# Patient Record
Sex: Female | Born: 1965 | Race: White | Hispanic: No | Marital: Married | Smoking: Never smoker
Health system: Southern US, Community
[De-identification: ages and names within clinical notes are randomized; demographics above are authoritative.]

## PROBLEM LIST (undated history)

## (undated) DIAGNOSIS — Z87442 Personal history of urinary calculi: Secondary | ICD-10-CM

## (undated) DIAGNOSIS — H04123 Dry eye syndrome of bilateral lacrimal glands: Secondary | ICD-10-CM

## (undated) DIAGNOSIS — K649 Unspecified hemorrhoids: Secondary | ICD-10-CM

## (undated) DIAGNOSIS — M1990 Unspecified osteoarthritis, unspecified site: Secondary | ICD-10-CM

## (undated) DIAGNOSIS — R51 Headache: Secondary | ICD-10-CM

## (undated) DIAGNOSIS — L989 Disorder of the skin and subcutaneous tissue, unspecified: Secondary | ICD-10-CM

## (undated) DIAGNOSIS — G43909 Migraine, unspecified, not intractable, without status migrainosus: Secondary | ICD-10-CM

## (undated) DIAGNOSIS — K5792 Diverticulitis of intestine, part unspecified, without perforation or abscess without bleeding: Secondary | ICD-10-CM

## (undated) DIAGNOSIS — Z8489 Family history of other specified conditions: Secondary | ICD-10-CM

## (undated) HISTORY — PX: ABDOMINAL HYSTERECTOMY: SHX81

## (undated) HISTORY — DX: Diverticulitis of intestine, part unspecified, without perforation or abscess without bleeding: K57.92

## (undated) HISTORY — PX: COLONOSCOPY: SHX174

## (undated) HISTORY — DX: Dry eye syndrome of bilateral lacrimal glands: H04.123

## (undated) HISTORY — DX: Disorder of the skin and subcutaneous tissue, unspecified: L98.9

## (undated) HISTORY — PX: OTHER SURGICAL HISTORY: SHX169

## (undated) HISTORY — PX: INCONTINENCE SURGERY: SHX676

## (undated) HISTORY — PX: CHOLECYSTECTOMY: SHX55

## (undated) HISTORY — DX: Migraine, unspecified, not intractable, without status migrainosus: G43.909

## (undated) HISTORY — DX: Unspecified hemorrhoids: K64.9

---
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
## 2002-08-03 HISTORY — PX: CHOLECYSTECTOMY: SHX55

## 2004-08-03 HISTORY — PX: ABDOMINAL HYSTERECTOMY: SHX81

## 2011-04-19 ENCOUNTER — Emergency Department (HOSPITAL_COMMUNITY): Payer: No Typology Code available for payment source

## 2011-04-19 ENCOUNTER — Emergency Department (HOSPITAL_COMMUNITY)
Admission: EM | Admit: 2011-04-19 | Discharge: 2011-04-19 | Disposition: A | Discharge status: Home / Self Care | Payer: No Typology Code available for payment source | Attending: Emergency Medicine | Admitting: Emergency Medicine

## 2011-04-19 DIAGNOSIS — S52509A Unspecified fracture of the lower end of unspecified radius, initial encounter for closed fracture: Secondary | ICD-10-CM | POA: Insufficient documentation

## 2011-04-19 HISTORY — PX: OTHER SURGICAL HISTORY: SHX169

## 2013-02-01 ENCOUNTER — Other Ambulatory Visit: Payer: Self-pay | Admitting: Family Medicine

## 2013-02-01 DIAGNOSIS — Z1231 Encounter for screening mammogram for malignant neoplasm of breast: Secondary | ICD-10-CM

## 2013-02-07 ENCOUNTER — Other Ambulatory Visit: Payer: Self-pay | Admitting: *Deleted

## 2013-02-07 DIAGNOSIS — I83893 Varicose veins of bilateral lower extremities with other complications: Secondary | ICD-10-CM

## 2013-02-21 ENCOUNTER — Ambulatory Visit
Admission: RE | Admit: 2013-02-21 | Discharge: 2013-02-21 | Disposition: A | Discharge status: Home / Self Care | Payer: BC Managed Care – PPO | Source: Ambulatory Visit | Attending: Family Medicine | Admitting: Family Medicine

## 2013-02-21 DIAGNOSIS — Z1231 Encounter for screening mammogram for malignant neoplasm of breast: Secondary | ICD-10-CM

## 2013-03-20 ENCOUNTER — Encounter: Payer: Self-pay | Admitting: Vascular Surgery

## 2013-03-21 ENCOUNTER — Encounter (INDEPENDENT_AMBULATORY_CARE_PROVIDER_SITE_OTHER): Payer: BC Managed Care – PPO | Admitting: *Deleted

## 2013-03-21 ENCOUNTER — Ambulatory Visit (INDEPENDENT_AMBULATORY_CARE_PROVIDER_SITE_OTHER): Payer: BC Managed Care – PPO | Admitting: Vascular Surgery

## 2013-03-21 ENCOUNTER — Encounter: Payer: Self-pay | Admitting: Vascular Surgery

## 2013-03-21 VITALS — BP 149/96 | HR 66 | Resp 16 | Ht 64.0 in | Wt 192.0 lb

## 2013-03-21 DIAGNOSIS — Z01810 Encounter for preprocedural cardiovascular examination: Secondary | ICD-10-CM

## 2013-03-21 DIAGNOSIS — I83893 Varicose veins of bilateral lower extremities with other complications: Secondary | ICD-10-CM

## 2013-03-21 NOTE — Progress Notes (Signed)
Subjective:     Patient ID: Sarah Krause, female   DOB: 07/22/1966, 47 y.o.   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 These have been present for the past few years and the symptoms have become more severe. This includes aching throbbing burning and stinging discomfort in the right leg which worsens as the day progresses. She has tried short leg elastic compression stockings with no improvement. She has no history of DVT thrombophlebitis stasis ulcers or bleeding. She has no symptoms in the contralateral left leg. 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  History reviewed. No pertinent past medical history.  History  Substance Use Topics  . Smoking status: Never Smoker   . Smokeless tobacco: Never Used  . Alcohol Use: 0.6 oz/week    1 Glasses of wine per week    Family History  Problem Relation Age of Onset  . Heart disease Father   . Diabetes Brother     Allergies  Allergen Reactions  . Sulfa Antibiotics Hives    No current outpatient prescriptions on file.  BP 149/96  Pulse 66  Resp 16  Ht 5\' 4"  (1.626 m)  Wt 192 lb (87.091 kg)  BMI 32.94 kg/m2  Body mass index is 32.94 kg/(m^2).          Review of Systems denies chest pain, dyspnea on exertion, PND, orthopnea, hemoptysis, claudication. All systems negative complete review of systems    Objective:   Physical Exam BP 149/96  Pulse 66  Resp 16  Ht 5\' 4"  (1.626 m)  Wt 192 lb (87.091 kg)  BMI 32.94 kg/m2  Gen.-alert and oriented x3 in no apparent distress HEENT normal for age Lungs no rhonchi or wheezing Cardiovascular regular rhythm no murmurs carotid pulses 3+ palpable no bruits audible Abdomen soft nontender no palpable masses Musculoskeletal free of  major deformities Skin clear -no rashes Neurologic normal Lower extremities 3+ femoral and dorsalis pedis pulses palpable  bilaterally with no edema Right leg with bulging varicosities of the great saphenous system beginning in the knee and extending down to the medial malleolus. There is 1+ edema distally. No hyperpigmentation or active ulceration is noted. Left leg is free varicosities.  Today I ordered a venous duplex exam of the right leg which are reviewed and interpreted. There is gross reflux throughout the right great saphenous vein from the saphenofemoral junction to the knee with a large size vein and no DVT. There also is some mild reflux in the right small saphenous vein but it is a small-caliber vein.     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 If no dramatic improvement patient needs laser ablation right great saphenous vein with 10-20 stab phlebectomy a single procedure

## 2013-06-26 ENCOUNTER — Encounter: Payer: Self-pay | Admitting: Vascular Surgery

## 2013-06-27 ENCOUNTER — Encounter: Payer: Self-pay | Admitting: Vascular Surgery

## 2013-06-27 ENCOUNTER — Ambulatory Visit (INDEPENDENT_AMBULATORY_CARE_PROVIDER_SITE_OTHER): Payer: BC Managed Care – PPO | Admitting: Vascular Surgery

## 2013-06-27 VITALS — BP 132/91 | HR 71 | Ht 64.0 in | Wt 198.3 lb

## 2013-06-27 DIAGNOSIS — I83893 Varicose veins of bilateral lower extremities with other complications: Secondary | ICD-10-CM

## 2013-06-27 NOTE — Progress Notes (Signed)
Subjective:     Patient ID: Sarah Krause, female   DOB: [birth year], 47 y.o.   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 She has tried long-leg elastic compression stockings 20-30 mm gradient as well as elevation and ibuprofen and continues to have aching throbbing and burning discomfort as well as progressive edema in the ankle area as the day progresses. She has no history of DVT or thrombophlebitis. 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  No past medical history on file.  History  Substance Use Topics  . Smoking status: Never Smoker   . Smokeless tobacco: Never Used  . Alcohol Use: 0.6 oz/week    1 Glasses of wine per week    Family History  Problem Relation Age of Onset  . Heart disease Father   . Diabetes Brother     Allergies  Allergen Reactions  . Sulfa Antibiotics Hives    Current outpatient prescriptions:Dextromethorphan-Guaifenesin (MUCINEX DM PO), Take 1 tablet by mouth 2 (two) times daily. , Disp: , Rfl:   BP 132/91  Pulse 71  Ht 5\' 4"  (1.626 m)  Wt 198 lb 4.8 oz (89.948 kg)  BMI 34.02 kg/m2  SpO2 100%  Body mass index is 34.02 kg/(m^2).           Review of Systems denies chest pain, dyspnea on exertion, PND, orthopnea, hemoptysis    Objective:   Physical Exam BP 132/91  Pulse 71  Ht 5\' 4"  (1.626 m)  Wt 198 lb 4.8 oz (89.948 kg)  BMI 34.02 kg/m2  SpO2 100%  General well-developed well-nourished female no apparent stress alert and oriented x3 Lungs no rhonchi or wheezing Right lower extremity with 3+ femoral and dorsalis pedis pulse palpable. There bulging varicosities in the medial calf along the course the great saphenous vein. 1+ distal edema with no active ulcer or hyperpigmentation.     Assessment:     Gross reflux right great saphenous vein with painful  varicosities-affecting patient's daily living and ability to work-resistant to conservative measures    Plan:     Patient needs a laser ablation right great saphenous vein with 10-20 stab phlebectomy of painful varicosities Will proceed with precertification to perform this in the near future

## 2013-07-06 ENCOUNTER — Telehealth: Payer: Self-pay | Admitting: Vascular Surgery

## 2013-07-06 NOTE — Telephone Encounter (Signed)
° ° °   Micki Riley, RN [facility]            Jerrell Mylar needs a one wk fu lab and JDL on 1/19. Her DOB: 2065/09/20      07/06/13: sent to [facility]

## 2013-07-11 ENCOUNTER — Other Ambulatory Visit: Payer: Self-pay | Admitting: *Deleted

## 2013-07-11 DIAGNOSIS — I83893 Varicose veins of bilateral lower extremities with other complications: Secondary | ICD-10-CM

## 2013-08-14 ENCOUNTER — Other Ambulatory Visit: Payer: BC Managed Care – PPO | Admitting: Vascular Surgery

## 2013-08-21 ENCOUNTER — Encounter (HOSPITAL_COMMUNITY): Payer: BC Managed Care – PPO

## 2013-08-21 ENCOUNTER — Ambulatory Visit: Payer: BC Managed Care – PPO | Admitting: Vascular Surgery

## 2014-08-03 DIAGNOSIS — I83891 Varicose veins of right lower extremities with other complications: Secondary | ICD-10-CM

## 2014-08-03 HISTORY — DX: Varicose veins of right lower extremity with other complications: I83.891

## 2014-08-03 HISTORY — PX: INCONTINENCE SURGERY: SHX676

## 2014-08-03 HISTORY — PX: OTHER SURGICAL HISTORY: SHX169

## 2014-08-06 ENCOUNTER — Other Ambulatory Visit: Payer: Self-pay | Admitting: *Deleted

## 2014-08-06 DIAGNOSIS — I83891 Varicose veins of right lower extremities with other complications: Secondary | ICD-10-CM

## 2014-08-20 ENCOUNTER — Encounter: Payer: Self-pay | Admitting: Vascular Surgery

## 2014-08-21 ENCOUNTER — Ambulatory Visit (INDEPENDENT_AMBULATORY_CARE_PROVIDER_SITE_OTHER): Payer: BLUE CROSS/BLUE SHIELD | Admitting: Vascular Surgery

## 2014-08-21 ENCOUNTER — Ambulatory Visit (HOSPITAL_COMMUNITY)
Admission: RE | Admit: 2014-08-21 | Discharge: 2014-08-21 | Disposition: A | Discharge status: Home / Self Care | Payer: BLUE CROSS/BLUE SHIELD | Source: Ambulatory Visit | Attending: Vascular Surgery | Admitting: Vascular Surgery

## 2014-08-21 ENCOUNTER — Encounter: Payer: Self-pay | Admitting: Vascular Surgery

## 2014-08-21 VITALS — BP 136/89 | HR 62 | Resp 16 | Ht 64.0 in | Wt 168.0 lb

## 2014-08-21 DIAGNOSIS — I83891 Varicose veins of right lower extremities with other complications: Secondary | ICD-10-CM | POA: Insufficient documentation

## 2014-08-21 NOTE — Progress Notes (Signed)
Subjective:     Patient ID: Sarah Krause, female   DOB: 23-Jul-1966, 49 y.o.   MRN: [id number]  HPI this 49 year old female returns for continued follow-up regarding her painful varicosities in the right leg. I last saw her in November 2014. She was found to have gross reflux in her right great saphenous vein with painful varicosities in the right thigh and calf. These symptoms were not responding to conservative measures including long-leg elastic compression stockings 20-30 millimeter gradient, elevation, and ibuprofen. 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 She is now ready to proceed with treatment. She continues to wear a long-leg elastic compression stockings with no improvement. She has no history of DVT. She has also lost 30 pounds over this time period and this has not helped her symptoms either.  History reviewed. No pertinent past medical history.  History  Substance Use Topics  . Smoking status: Never Smoker   . Smokeless tobacco: Never Used  . Alcohol Use: 0.6 oz/week    1 Glasses of wine per week    Family History  Problem Relation Age of Onset  . Heart disease Father   . Diabetes Brother     Allergies  Allergen Reactions  . Sulfa Antibiotics Hives     Current outpatient prescriptions:  .  Dextromethorphan-Guaifenesin (MUCINEX DM PO), Take 1 tablet by mouth 2 (two) times daily. , Disp: , Rfl:   BP 136/89 mmHg  Pulse 62  Resp 16  Ht 5\' 4"  (1.626 m)  Wt 168 lb (76.204 kg)  BMI 28.82 kg/m2  Body mass index is 28.82 kg/(m^2).           Review of Systems denies chest pain, dyspnea on exertion, PND, orthopnea, hemoptysis, claudication.     Objective:   Physical Exam BP 136/89 mmHg  Pulse 62  Resp 16  Ht 5\' 4"  (1.626 m)  Wt 168 lb (76.204 kg)  BMI 28.82 kg/m2  Gen. alert and oriented 3 in no apparent distress Lungs no rhonchi or wheezing Right leg with 1+  chronic edema. Large bulging varicosities beginning in the distal thigh medially over the great saphenous vein extending to the ankle. No hyperpigmentation or active ulceration noted.  Today I ordered a venous duplex exam which I reviewed and interpreted. The great saphenous vein is quite large and has gross reflux throughout up to the saphenofemoral junction and there is no DVT.      Assessment:     Painful varicosities right leg due to gross reflux right great saphenous vein. 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    Plan:     Patient needs laser ablation right great saphenous vein +10-20 stab phlebectomy of painful varicosities as a single procedure We will proceed with precertification to perform this in the near future to relieve her symptoms.

## 2014-09-07 ENCOUNTER — Encounter (HOSPITAL_BASED_OUTPATIENT_CLINIC_OR_DEPARTMENT_OTHER): Payer: Self-pay

## 2014-09-07 ENCOUNTER — Ambulatory Visit (HOSPITAL_BASED_OUTPATIENT_CLINIC_OR_DEPARTMENT_OTHER)
Admission: RE | Admit: 2014-09-07 | Discharge: 2014-09-07 | Disposition: A | Discharge status: Home / Self Care | Payer: BLUE CROSS/BLUE SHIELD | Source: Ambulatory Visit | Attending: Physician Assistant | Admitting: Physician Assistant

## 2014-09-07 ENCOUNTER — Other Ambulatory Visit (HOSPITAL_BASED_OUTPATIENT_CLINIC_OR_DEPARTMENT_OTHER): Payer: Self-pay | Admitting: Physician Assistant

## 2014-09-07 DIAGNOSIS — Z9049 Acquired absence of other specified parts of digestive tract: Secondary | ICD-10-CM | POA: Diagnosis not present

## 2014-09-07 DIAGNOSIS — Z90710 Acquired absence of both cervix and uterus: Secondary | ICD-10-CM | POA: Insufficient documentation

## 2014-09-07 DIAGNOSIS — R109 Unspecified abdominal pain: Secondary | ICD-10-CM | POA: Insufficient documentation

## 2014-09-07 DIAGNOSIS — R933 Abnormal findings on diagnostic imaging of other parts of digestive tract: Secondary | ICD-10-CM | POA: Diagnosis not present

## 2014-09-07 HISTORY — PX: OTHER SURGICAL HISTORY: SHX169

## 2014-09-07 MED ORDER — IOHEXOL 300 MG/ML  SOLN
100.0000 mL | Freq: Once | INTRAMUSCULAR | Status: AC | PRN
  Administered 2014-09-07: 100 mL via INTRAVENOUS

## 2014-10-19 ENCOUNTER — Encounter: Payer: Self-pay | Admitting: Vascular Surgery

## 2014-10-22 ENCOUNTER — Ambulatory Visit (INDEPENDENT_AMBULATORY_CARE_PROVIDER_SITE_OTHER): Payer: BLUE CROSS/BLUE SHIELD | Admitting: Vascular Surgery

## 2014-10-22 ENCOUNTER — Encounter: Payer: Self-pay | Admitting: Vascular Surgery

## 2014-10-22 VITALS — BP 135/90 | HR 68 | Resp 16 | Ht 64.0 in | Wt 171.0 lb

## 2014-10-22 DIAGNOSIS — I83891 Varicose veins of right lower extremities with other complications: Secondary | ICD-10-CM

## 2014-10-22 DIAGNOSIS — I83899 Varicose veins of unspecified lower extremities with other complications: Secondary | ICD-10-CM | POA: Insufficient documentation

## 2014-10-22 NOTE — Progress Notes (Signed)
Subjective:     Patient ID: Sarah Krause, female   DOB: [birth year], 49 y.o.   MRN: [id number]  HPI patient had laser ablation of the right great saphenous vein +10-20 stab phlebectomy of painful varicosities performed under local tumescent anesthesia. She tolerated the procedure well. A total of 1824 J of energy was utilized.    Review of Systems     Objective:   Physical Exam BP 135/90 mmHg  Pulse 68  Resp 16  Ht 5\' 4"  (1.626 m)  Wt 171 lb (77.565 kg)  BMI 29.34 kg/m2       Assessment:     Well-tolerated laser ablation right great saphenous vein +10-20 stab phlebectomy of painful varicosities performed under local tumescent anesthesia    Plan:     Return in one week for venous duplex exam to confirm closure right great saphenous vein

## 2014-10-22 NOTE — Progress Notes (Signed)
Laser Ablation Procedure    Date: 10/22/2014   Sarah Krause DOB:[birth year]  Consent signed: Yes    Surgeon:  Dr. Nelda Severe. Kellie Simmering  Procedure: Laser Ablation: right Greater Saphenous Vein  BP 135/90 mmHg  Pulse 68  Resp 16  Ht 5\' 4"  (1.626 m)  Wt 171 lb (77.565 kg)  BMI 29.34 kg/m2  Tumescent Anesthesia: 400 cc 0.9% NaCl with 50 cc Lidocaine HCL with 1% Epi and 15 cc 8.4% NaHCO3  Local Anesthesia: 8 cc Lidocaine HCL and NaHCO3 (ratio 2:1)  Pulsed Mode: 15 watts, 593ms delay, 1.0 duration  Total Energy:1826              Total Pulses:122                Total Time: 2:02    Stab Phlebectomy: 10-20 Sites: Calf  Patient tolerated procedure well  Notes:   Description of Procedure:  After marking the course of the secondary varicosities, the patient was placed on the operating table in the supine position, and the right leg was prepped and draped in sterile fashion.   Local anesthetic was administered and under ultrasound guidance the saphenous vein was accessed with a micro needle and guide wire; then the mirco puncture sheath was placed.  A guide wire was inserted saphenofemoral junction , followed by a 5 french sheath.  The position of the sheath and then the laser fiber below the junction was confirmed using the ultrasound.  Tumescent anesthesia was administered along the course of the saphenous vein using ultrasound guidance. The patient was placed in Trendelenburg position and protective laser glasses were placed on patient and staff, and the laser was fired at 15 watts continuous mode advancing 1-10mm/second for a total of 1826 joules.   For stab phlebectomies, local anesthetic was administered at the previously marked varicosities, and tumescent anesthesia was administered around the vessels.  Ten to 20 stab wounds were made using the tip of an 11 blade. And using the vein hook, the phlebectomies were performed using a hemostat to avulse the varicosities.  Adequate hemostasis was  achieved.     Steri strips were applied to the stab wounds and ABD pads and thigh high compression stockings were applied.  Ace wrap bandages were applied over the phlebectomy sites and at the top of the saphenofemoral junction. Blood loss was less than 15 cc.  The patient ambulated out of the operating room having tolerated the procedure well.

## 2014-10-23 ENCOUNTER — Telehealth: Payer: Self-pay | Admitting: *Deleted

## 2014-10-23 NOTE — Telephone Encounter (Signed)
Pt having some discomfort. No bleeding. Told her she could loosen the ace wraps. She thinks that will help her discomfort. Following all instructions.

## 2014-10-29 ENCOUNTER — Other Ambulatory Visit: Payer: BLUE CROSS/BLUE SHIELD | Admitting: Vascular Surgery

## 2014-10-29 ENCOUNTER — Encounter: Payer: Self-pay | Admitting: Vascular Surgery

## 2014-10-30 ENCOUNTER — Encounter: Payer: Self-pay | Admitting: Vascular Surgery

## 2014-10-30 ENCOUNTER — Ambulatory Visit (INDEPENDENT_AMBULATORY_CARE_PROVIDER_SITE_OTHER): Payer: Self-pay | Admitting: Vascular Surgery

## 2014-10-30 ENCOUNTER — Other Ambulatory Visit: Payer: Self-pay | Admitting: Vascular Surgery

## 2014-10-30 ENCOUNTER — Ambulatory Visit (HOSPITAL_COMMUNITY)
Admission: RE | Admit: 2014-10-30 | Discharge: 2014-10-30 | Disposition: A | Discharge status: Home / Self Care | Payer: BLUE CROSS/BLUE SHIELD | Source: Ambulatory Visit | Attending: Vascular Surgery | Admitting: Vascular Surgery

## 2014-10-30 VITALS — BP 140/87 | HR 72 | Resp 16 | Ht 64.0 in | Wt 172.0 lb

## 2014-10-30 DIAGNOSIS — I83891 Varicose veins of right lower extremities with other complications: Secondary | ICD-10-CM

## 2014-10-30 DIAGNOSIS — I8393 Asymptomatic varicose veins of bilateral lower extremities: Secondary | ICD-10-CM

## 2014-10-30 DIAGNOSIS — Z48812 Encounter for surgical aftercare following surgery on the circulatory system: Secondary | ICD-10-CM | POA: Diagnosis present

## 2014-10-30 NOTE — Progress Notes (Signed)
Subjective:     Patient ID: Clydene Pugh, female   DOB: 10/02/1965, 49 y.o.   MRN: [id number]  HPI this 49 year old female returns 1 week post laser ablation right great saphenous vein plus multiple stab phlebectomy of painful varicosities due to reflux in the right great saphenous vein. She states that the lower leg artery feels much better with less tightness and heaviness and less discomfort. She has had no distal edema. She did take her ibuprofen as instructed and wear the long leg elastic compression stocking.   Review of Systems     Objective:   Physical Exam BP 140/87 mmHg  Pulse 72  Resp 16  Ht 5\' 4"  (1.626 m)  Wt 172 lb (78.019 kg)  BMI 29.51 kg/m2  Gen. well-developed well-nourished female no apparent distress alert and oriented 3 Right leg with mild tenderness to palpation of great saphenous vein from distal thigh proximally. No erythema or blistering noted. Stab phlebectomy sites healing nicely. No distal edema. 3+ dorsalis pedis pulse palpable.  Today I ordered a venous duplex exam of the right leg which I reviewed and interpreted. There is no DVT. There is total closure of the right great saphenous vein.     Assessment:     Successful and well-tolerated laser ablation right great saphenous vein with multiple stab phlebectomy of painful varicosities     Plan:     Return to see me on when necessary basis

## 2014-11-05 ENCOUNTER — Ambulatory Visit: Payer: BLUE CROSS/BLUE SHIELD | Admitting: Vascular Surgery

## 2014-11-05 ENCOUNTER — Encounter (HOSPITAL_COMMUNITY): Payer: BLUE CROSS/BLUE SHIELD

## 2015-01-22 DIAGNOSIS — N819 Female genital prolapse, unspecified: Secondary | ICD-10-CM | POA: Insufficient documentation

## 2016-01-08 DIAGNOSIS — J019 Acute sinusitis, unspecified: Secondary | ICD-10-CM | POA: Diagnosis not present

## 2016-10-01 DIAGNOSIS — S83242A Other tear of medial meniscus, current injury, left knee, initial encounter: Secondary | ICD-10-CM | POA: Diagnosis not present

## 2016-10-22 DIAGNOSIS — S83242D Other tear of medial meniscus, current injury, left knee, subsequent encounter: Secondary | ICD-10-CM | POA: Diagnosis not present

## 2016-11-24 DIAGNOSIS — W57XXXA Bitten or stung by nonvenomous insect and other nonvenomous arthropods, initial encounter: Secondary | ICD-10-CM | POA: Diagnosis not present

## 2016-11-24 DIAGNOSIS — J329 Chronic sinusitis, unspecified: Secondary | ICD-10-CM | POA: Diagnosis not present

## 2017-04-03 DIAGNOSIS — L237 Allergic contact dermatitis due to plants, except food: Secondary | ICD-10-CM | POA: Diagnosis not present

## 2017-04-07 DIAGNOSIS — B351 Tinea unguium: Secondary | ICD-10-CM | POA: Diagnosis not present

## 2017-04-17 DIAGNOSIS — L259 Unspecified contact dermatitis, unspecified cause: Secondary | ICD-10-CM | POA: Diagnosis not present

## 2017-07-22 ENCOUNTER — Ambulatory Visit (INDEPENDENT_AMBULATORY_CARE_PROVIDER_SITE_OTHER): Payer: BLUE CROSS/BLUE SHIELD | Admitting: Podiatry

## 2017-07-22 ENCOUNTER — Ambulatory Visit: Payer: Self-pay

## 2017-07-22 DIAGNOSIS — M722 Plantar fascial fibromatosis: Secondary | ICD-10-CM

## 2017-07-22 MED ORDER — MELOXICAM 15 MG PO TABS: 15.0000 mg | ORAL_TABLET | Freq: Every day | ORAL | 0 refills | Status: DC

## 2017-07-22 NOTE — Progress Notes (Signed)
   Subjective:    Patient ID: Sarah Krause, female    DOB: 03/30/1966, 51 y.o.   MRN: [id number] Chief Complaint  Patient presents with  . Foot Pain    R. lateral arch pain x 1 mo; 9/10 sharp pain Tx; Aspirin     HPI 51 y.o. female presents with the above complaint.  Reports right lateral arch pain 9 out of 10 pain has been treated with as before.  Works for [employer].  No past medical history on file. Past Surgical History:  Procedure Laterality Date  . ABDOMINAL HYSTERECTOMY    . CHOLECYSTECTOMY      Current Outpatient Medications:  .  Dextromethorphan-Guaifenesin (MUCINEX DM PO), Take 1 tablet by mouth 2 (two) times daily. , Disp: , Rfl:  .  meloxicam (MOBIC) 15 MG tablet, Take 1 tablet (15 mg total) by mouth daily., Disp: 30 tablet, Rfl: 0  Allergies  Allergen Reactions  . Sulfa Antibiotics Hives    Review of Systems  All other systems reviewed and are negative.     Objective:   Physical Exam There were no vitals filed for this visit. General AA&O x3. Normal mood and affect.  Vascular Dorsalis pedis and posterior tibial pulses  present 2+ bilaterally  Capillary refill normal to all digits. Pedal hair growth normal.  Neurologic Epicritic sensation grossly present.  Dermatologic No open lesions. Interspaces clear of maceration. Nails well groomed and normal in appearance.  Orthopedic: MMT 5/5 in dorsiflexion, plantarflexion, inversion, and eversion. Normal joint ROM without pain or crepitus. Pain to palpation about the medial arch bilaterally.  Pain along palpation of the posterior tibial tendon.      Assessment & Plan:  Patient was evaluated and treated all questions answered  Arch pain, PTTD -Educated on etiology -Discussed proper shoe gear. -Rx meloxicam -Would benefit from custom molded orthotics.  Will make appointment with patient for fabrication.  Follow-up after fabrication

## 2017-07-22 NOTE — Patient Instructions (Signed)

## 2017-08-02 ENCOUNTER — Ambulatory Visit: Payer: BLUE CROSS/BLUE SHIELD | Admitting: Orthotics

## 2017-08-02 DIAGNOSIS — M722 Plantar fascial fibromatosis: Secondary | ICD-10-CM

## 2017-08-02 NOTE — Progress Notes (Signed)

## 2017-08-06 ENCOUNTER — Other Ambulatory Visit: Payer: Self-pay | Admitting: Orthotics

## 2017-08-16 ENCOUNTER — Telehealth: Payer: Self-pay | Admitting: *Deleted

## 2017-08-16 NOTE — Telephone Encounter (Signed)
Request of 90 days of meloxicam. Dr. March Rummage states pt needs an appt prior to future refills. Return fax denying.

## 2017-08-23 ENCOUNTER — Encounter: Payer: BLUE CROSS/BLUE SHIELD | Admitting: Orthotics

## 2017-08-26 ENCOUNTER — Ambulatory Visit (INDEPENDENT_AMBULATORY_CARE_PROVIDER_SITE_OTHER): Payer: BLUE CROSS/BLUE SHIELD | Admitting: Orthotics

## 2017-08-26 DIAGNOSIS — M722 Plantar fascial fibromatosis: Secondary | ICD-10-CM

## 2017-08-26 NOTE — Progress Notes (Signed)
Patient came in today to pick up custom made foot orthotics.  The goals were accomplished and the patient reported no dissatisfaction with said orthotics.  Patient was advised of breakin period and how to report any issues. 

## 2017-09-08 DIAGNOSIS — M722 Plantar fascial fibromatosis: Secondary | ICD-10-CM

## 2017-11-09 DIAGNOSIS — M9905 Segmental and somatic dysfunction of pelvic region: Secondary | ICD-10-CM | POA: Diagnosis not present

## 2017-11-09 DIAGNOSIS — M5136 Other intervertebral disc degeneration, lumbar region: Secondary | ICD-10-CM | POA: Diagnosis not present

## 2017-11-09 DIAGNOSIS — M9904 Segmental and somatic dysfunction of sacral region: Secondary | ICD-10-CM | POA: Diagnosis not present

## 2017-11-09 DIAGNOSIS — M160 Bilateral primary osteoarthritis of hip: Secondary | ICD-10-CM | POA: Diagnosis not present

## 2017-11-09 DIAGNOSIS — M25551 Pain in right hip: Secondary | ICD-10-CM | POA: Diagnosis not present

## 2017-11-09 DIAGNOSIS — M7061 Trochanteric bursitis, right hip: Secondary | ICD-10-CM | POA: Diagnosis not present

## 2017-11-09 HISTORY — PX: OTHER SURGICAL HISTORY: SHX169

## 2017-11-13 DIAGNOSIS — M546 Pain in thoracic spine: Secondary | ICD-10-CM | POA: Diagnosis not present

## 2017-11-13 DIAGNOSIS — M7061 Trochanteric bursitis, right hip: Secondary | ICD-10-CM | POA: Diagnosis not present

## 2017-11-13 DIAGNOSIS — M9902 Segmental and somatic dysfunction of thoracic region: Secondary | ICD-10-CM | POA: Diagnosis not present

## 2017-11-13 DIAGNOSIS — M25551 Pain in right hip: Secondary | ICD-10-CM | POA: Diagnosis not present

## 2017-11-13 DIAGNOSIS — M545 Low back pain: Secondary | ICD-10-CM | POA: Diagnosis not present

## 2017-11-13 DIAGNOSIS — M9905 Segmental and somatic dysfunction of pelvic region: Secondary | ICD-10-CM | POA: Diagnosis not present

## 2017-11-13 DIAGNOSIS — M9903 Segmental and somatic dysfunction of lumbar region: Secondary | ICD-10-CM | POA: Diagnosis not present

## 2017-11-13 DIAGNOSIS — M9904 Segmental and somatic dysfunction of sacral region: Secondary | ICD-10-CM | POA: Diagnosis not present

## 2017-11-15 DIAGNOSIS — M25551 Pain in right hip: Secondary | ICD-10-CM | POA: Diagnosis not present

## 2017-11-15 DIAGNOSIS — M9905 Segmental and somatic dysfunction of pelvic region: Secondary | ICD-10-CM | POA: Diagnosis not present

## 2017-11-15 DIAGNOSIS — M9904 Segmental and somatic dysfunction of sacral region: Secondary | ICD-10-CM | POA: Diagnosis not present

## 2017-11-15 DIAGNOSIS — M7061 Trochanteric bursitis, right hip: Secondary | ICD-10-CM | POA: Diagnosis not present

## 2017-11-23 DIAGNOSIS — M9904 Segmental and somatic dysfunction of sacral region: Secondary | ICD-10-CM | POA: Diagnosis not present

## 2017-11-23 DIAGNOSIS — M25551 Pain in right hip: Secondary | ICD-10-CM | POA: Diagnosis not present

## 2017-11-23 DIAGNOSIS — M5416 Radiculopathy, lumbar region: Secondary | ICD-10-CM | POA: Diagnosis not present

## 2017-11-23 DIAGNOSIS — M7061 Trochanteric bursitis, right hip: Secondary | ICD-10-CM | POA: Diagnosis not present

## 2017-11-23 DIAGNOSIS — M9905 Segmental and somatic dysfunction of pelvic region: Secondary | ICD-10-CM | POA: Diagnosis not present

## 2017-12-02 DIAGNOSIS — M5127 Other intervertebral disc displacement, lumbosacral region: Secondary | ICD-10-CM | POA: Diagnosis not present

## 2017-12-02 HISTORY — PX: OTHER SURGICAL HISTORY: SHX169

## 2017-12-02 HISTORY — DX: Other intervertebral disc displacement, lumbosacral region: M51.27

## 2017-12-06 DIAGNOSIS — M5116 Intervertebral disc disorders with radiculopathy, lumbar region: Secondary | ICD-10-CM | POA: Diagnosis not present

## 2017-12-06 DIAGNOSIS — M5136 Other intervertebral disc degeneration, lumbar region: Secondary | ICD-10-CM | POA: Diagnosis not present

## 2017-12-17 DIAGNOSIS — M5416 Radiculopathy, lumbar region: Secondary | ICD-10-CM | POA: Diagnosis not present

## 2017-12-29 ENCOUNTER — Other Ambulatory Visit: Payer: Self-pay | Admitting: Orthopedic Surgery

## 2018-01-03 ENCOUNTER — Encounter (HOSPITAL_COMMUNITY): Payer: Self-pay | Admitting: *Deleted

## 2018-01-03 DIAGNOSIS — M5416 Radiculopathy, lumbar region: Secondary | ICD-10-CM | POA: Diagnosis not present

## 2018-01-04 ENCOUNTER — Encounter (HOSPITAL_COMMUNITY): Payer: Self-pay | Admitting: *Deleted

## 2018-01-04 ENCOUNTER — Other Ambulatory Visit: Payer: Self-pay

## 2018-01-05 ENCOUNTER — Ambulatory Visit (HOSPITAL_COMMUNITY): Payer: BLUE CROSS/BLUE SHIELD

## 2018-01-05 ENCOUNTER — Ambulatory Visit (HOSPITAL_COMMUNITY): Payer: BLUE CROSS/BLUE SHIELD | Admitting: Anesthesiology

## 2018-01-05 ENCOUNTER — Encounter (HOSPITAL_COMMUNITY)
Admission: RE | Disposition: A | Discharge status: Home / Self Care | Payer: Self-pay | Source: Ambulatory Visit | Attending: Orthopedic Surgery

## 2018-01-05 ENCOUNTER — Ambulatory Visit (HOSPITAL_COMMUNITY)
Admission: RE | Admit: 2018-01-05 | Discharge: 2018-01-05 | Disposition: A | Discharge status: Home / Self Care | Payer: BLUE CROSS/BLUE SHIELD | Source: Ambulatory Visit | Attending: Orthopedic Surgery | Admitting: Orthopedic Surgery

## 2018-01-05 ENCOUNTER — Encounter (HOSPITAL_COMMUNITY): Payer: Self-pay | Admitting: *Deleted

## 2018-01-05 DIAGNOSIS — Z882 Allergy status to sulfonamides status: Secondary | ICD-10-CM | POA: Diagnosis not present

## 2018-01-05 DIAGNOSIS — M5116 Intervertebral disc disorders with radiculopathy, lumbar region: Secondary | ICD-10-CM | POA: Insufficient documentation

## 2018-01-05 DIAGNOSIS — Z91018 Allergy to other foods: Secondary | ICD-10-CM | POA: Diagnosis not present

## 2018-01-05 DIAGNOSIS — J9811 Atelectasis: Secondary | ICD-10-CM | POA: Diagnosis not present

## 2018-01-05 DIAGNOSIS — Z01818 Encounter for other preprocedural examination: Secondary | ICD-10-CM

## 2018-01-05 DIAGNOSIS — M5417 Radiculopathy, lumbosacral region: Secondary | ICD-10-CM | POA: Diagnosis not present

## 2018-01-05 DIAGNOSIS — M5127 Other intervertebral disc displacement, lumbosacral region: Secondary | ICD-10-CM | POA: Diagnosis not present

## 2018-01-05 DIAGNOSIS — M5106 Intervertebral disc disorders with myelopathy, lumbar region: Secondary | ICD-10-CM | POA: Insufficient documentation

## 2018-01-05 DIAGNOSIS — M5410 Radiculopathy, site unspecified: Secondary | ICD-10-CM

## 2018-01-05 DIAGNOSIS — Z87892 Personal history of anaphylaxis: Secondary | ICD-10-CM | POA: Insufficient documentation

## 2018-01-05 DIAGNOSIS — M1990 Unspecified osteoarthritis, unspecified site: Secondary | ICD-10-CM | POA: Insufficient documentation

## 2018-01-05 DIAGNOSIS — M5117 Intervertebral disc disorders with radiculopathy, lumbosacral region: Secondary | ICD-10-CM | POA: Diagnosis not present

## 2018-01-05 DIAGNOSIS — Z981 Arthrodesis status: Secondary | ICD-10-CM | POA: Diagnosis not present

## 2018-01-05 HISTORY — DX: Unspecified osteoarthritis, unspecified site: M19.90

## 2018-01-05 HISTORY — PX: LUMBAR LAMINECTOMY/DECOMPRESSION MICRODISCECTOMY: SHX5026

## 2018-01-05 HISTORY — DX: Headache: R51

## 2018-01-05 HISTORY — DX: Personal history of urinary calculi: Z87.442

## 2018-01-05 HISTORY — DX: Family history of other specified conditions: Z84.89

## 2018-01-05 LAB — COMPREHENSIVE METABOLIC PANEL
ALT: 26 U/L (ref 14–54)
AST: 21 U/L (ref 15–41)
Albumin: 3.7 g/dL (ref 3.5–5.0)
Alkaline Phosphatase: 73 U/L (ref 38–126)
Anion gap: 7 (ref 5–15)
BUN: 13 mg/dL (ref 6–20)
CHLORIDE: 109 mmol/L (ref 101–111)
CO2: 26 mmol/L (ref 22–32)
CREATININE: 0.75 mg/dL (ref 0.44–1.00)
Calcium: 9.2 mg/dL (ref 8.9–10.3)
Glucose, Bld: 94 mg/dL (ref 65–99)
Potassium: 3.9 mmol/L (ref 3.5–5.1)
Sodium: 142 mmol/L (ref 135–145)
Total Bilirubin: 0.4 mg/dL (ref 0.3–1.2)
Total Protein: 6.6 g/dL (ref 6.5–8.1)

## 2018-01-05 LAB — URINALYSIS, ROUTINE W REFLEX MICROSCOPIC
Bilirubin Urine: NEGATIVE
Glucose, UA: NEGATIVE mg/dL
HGB URINE DIPSTICK: NEGATIVE
Ketones, ur: NEGATIVE mg/dL
Leukocytes, UA: NEGATIVE
Nitrite: NEGATIVE
Protein, ur: NEGATIVE mg/dL
SPECIFIC GRAVITY, URINE: 1.017 (ref 1.005–1.030)
pH: 6 (ref 5.0–8.0)

## 2018-01-05 LAB — ABO/RH: ABO/RH(D): A POS

## 2018-01-05 LAB — CBC WITH DIFFERENTIAL/PLATELET
ABS IMMATURE GRANULOCYTES: 0 10*3/uL (ref 0.0–0.1)
BASOS PCT: 2 %
Basophils Absolute: 0.1 10*3/uL (ref 0.0–0.1)
EOS PCT: 5 %
Eosinophils Absolute: 0.2 10*3/uL (ref 0.0–0.7)
HCT: 43.1 % (ref 36.0–46.0)
Hemoglobin: 14 g/dL (ref 12.0–15.0)
Immature Granulocytes: 0 %
Lymphocytes Relative: 40 %
Lymphs Abs: 1.6 10*3/uL (ref 0.7–4.0)
MCH: 28.2 pg (ref 26.0–34.0)
MCHC: 32.5 g/dL (ref 30.0–36.0)
MCV: 86.7 fL (ref 78.0–100.0)
MONOS PCT: 15 %
Monocytes Absolute: 0.6 10*3/uL (ref 0.1–1.0)
Neutro Abs: 1.6 10*3/uL — ABNORMAL LOW (ref 1.7–7.7)
Neutrophils Relative %: 38 %
PLATELETS: 203 10*3/uL (ref 150–400)
RBC: 4.97 MIL/uL (ref 3.87–5.11)
RDW: 13 % (ref 11.5–15.5)
WBC: 4.2 10*3/uL (ref 4.0–10.5)

## 2018-01-05 LAB — TYPE AND SCREEN
ABO/RH(D): A POS
Antibody Screen: NEGATIVE

## 2018-01-05 LAB — APTT: aPTT: 35 seconds (ref 24–36)

## 2018-01-05 LAB — PROTIME-INR
INR: 0.99
PROTHROMBIN TIME: 13 s (ref 11.4–15.2)

## 2018-01-05 SURGERY — LUMBAR LAMINECTOMY/DECOMPRESSION MICRODISCECTOMY
Anesthesia: General | Site: Spine Lumbar | Laterality: Right

## 2018-01-05 MED ORDER — POVIDONE-IODINE 7.5 % EX SOLN
Freq: Once | CUTANEOUS | Status: DC
  Filled 2018-01-05: qty 118

## 2018-01-05 MED ORDER — MIDAZOLAM HCL 2 MG/2ML IJ SOLN
INTRAMUSCULAR | Status: AC
  Filled 2018-01-05: qty 2

## 2018-01-05 MED ORDER — SUGAMMADEX SODIUM 200 MG/2ML IV SOLN
INTRAVENOUS | Status: DC | PRN
  Administered 2018-01-05: 200 mg via INTRAVENOUS

## 2018-01-05 MED ORDER — METHYLPREDNISOLONE ACETATE 80 MG/ML IJ SUSP
INTRAMUSCULAR | Status: AC
  Filled 2018-01-05: qty 1

## 2018-01-05 MED ORDER — DEXAMETHASONE SODIUM PHOSPHATE 10 MG/ML IJ SOLN
INTRAMUSCULAR | Status: AC
  Filled 2018-01-05: qty 1

## 2018-01-05 MED ORDER — MEPERIDINE HCL 50 MG/ML IJ SOLN: 6.2500 mg | INTRAMUSCULAR | Status: DC | PRN

## 2018-01-05 MED ORDER — LACTATED RINGERS IV SOLN
INTRAVENOUS | Status: DC
  Administered 2018-01-05 (×2): via INTRAVENOUS

## 2018-01-05 MED ORDER — LIDOCAINE 2% (20 MG/ML) 5 ML SYRINGE
INTRAMUSCULAR | Status: AC
  Filled 2018-01-05: qty 5

## 2018-01-05 MED ORDER — PHENYLEPHRINE HCL 10 MG/ML IJ SOLN
INTRAVENOUS | Status: DC | PRN
  Administered 2018-01-05: 25 ug/min via INTRAVENOUS

## 2018-01-05 MED ORDER — ONDANSETRON HCL 4 MG/2ML IJ SOLN
INTRAMUSCULAR | Status: AC
  Filled 2018-01-05: qty 2

## 2018-01-05 MED ORDER — PHENYLEPHRINE 40 MCG/ML (10ML) SYRINGE FOR IV PUSH (FOR BLOOD PRESSURE SUPPORT)
PREFILLED_SYRINGE | INTRAVENOUS | Status: DC | PRN
  Administered 2018-01-05: 40 ug via INTRAVENOUS
  Administered 2018-01-05 (×2): 80 ug via INTRAVENOUS

## 2018-01-05 MED ORDER — HYDROMORPHONE HCL 2 MG/ML IJ SOLN
INTRAMUSCULAR | Status: AC
  Filled 2018-01-05: qty 1

## 2018-01-05 MED ORDER — ROCURONIUM BROMIDE 10 MG/ML (PF) SYRINGE
PREFILLED_SYRINGE | INTRAVENOUS | Status: AC
  Filled 2018-01-05: qty 5

## 2018-01-05 MED ORDER — BUPIVACAINE-EPINEPHRINE 0.25% -1:200000 IJ SOLN
INTRAMUSCULAR | Status: AC
  Filled 2018-01-05: qty 1

## 2018-01-05 MED ORDER — THROMBIN 5000 UNITS EX SOLR
CUTANEOUS | Status: AC
  Filled 2018-01-05: qty 15000

## 2018-01-05 MED ORDER — PROPOFOL 10 MG/ML IV BOLUS
INTRAVENOUS | Status: AC
  Filled 2018-01-05: qty 20

## 2018-01-05 MED ORDER — MIDAZOLAM HCL 5 MG/5ML IJ SOLN
INTRAMUSCULAR | Status: DC | PRN
  Administered 2018-01-05: 2 mg via INTRAVENOUS

## 2018-01-05 MED ORDER — ROCURONIUM BROMIDE 10 MG/ML (PF) SYRINGE
PREFILLED_SYRINGE | INTRAVENOUS | Status: DC | PRN
  Administered 2018-01-05: 20 mg via INTRAVENOUS
  Administered 2018-01-05: 50 mg via INTRAVENOUS
  Administered 2018-01-05: 10 mg via INTRAVENOUS

## 2018-01-05 MED ORDER — METHYLENE BLUE 0.5 % INJ SOLN
INTRAVENOUS | Status: AC
  Filled 2018-01-05: qty 10

## 2018-01-05 MED ORDER — DEXAMETHASONE SODIUM PHOSPHATE 10 MG/ML IJ SOLN
INTRAMUSCULAR | Status: DC | PRN
  Administered 2018-01-05: 10 mg via INTRAVENOUS

## 2018-01-05 MED ORDER — LIDOCAINE 2% (20 MG/ML) 5 ML SYRINGE
INTRAMUSCULAR | Status: DC | PRN
  Administered 2018-01-05: 100 mg via INTRAVENOUS

## 2018-01-05 MED ORDER — BUPIVACAINE-EPINEPHRINE 0.25% -1:200000 IJ SOLN
INTRAMUSCULAR | Status: DC | PRN
  Administered 2018-01-05: 10 mL
  Administered 2018-01-05: 9 mL

## 2018-01-05 MED ORDER — ONDANSETRON HCL 4 MG/2ML IJ SOLN
INTRAMUSCULAR | Status: DC | PRN
  Administered 2018-01-05: 4 mg via INTRAVENOUS

## 2018-01-05 MED ORDER — FENTANYL CITRATE (PF) 250 MCG/5ML IJ SOLN
INTRAMUSCULAR | Status: AC
  Filled 2018-01-05: qty 5

## 2018-01-05 MED ORDER — CEFAZOLIN SODIUM-DEXTROSE 2-4 GM/100ML-% IV SOLN
2.0000 g | INTRAVENOUS | Status: AC
  Administered 2018-01-05: 2 g via INTRAVENOUS
  Filled 2018-01-05: qty 100

## 2018-01-05 MED ORDER — HYDROMORPHONE HCL 2 MG/ML IJ SOLN
0.3000 mg | INTRAMUSCULAR | Status: DC | PRN
  Administered 2018-01-05 (×2): 0.5 mg via INTRAVENOUS

## 2018-01-05 MED ORDER — FENTANYL CITRATE (PF) 250 MCG/5ML IJ SOLN
INTRAMUSCULAR | Status: DC | PRN
  Administered 2018-01-05: 100 ug via INTRAVENOUS
  Administered 2018-01-05: 50 ug via INTRAVENOUS

## 2018-01-05 MED ORDER — METHYLPREDNISOLONE ACETATE 40 MG/ML IJ SUSP
INTRAMUSCULAR | Status: DC | PRN
  Administered 2018-01-05: 40 mg

## 2018-01-05 MED ORDER — PROMETHAZINE HCL 25 MG/ML IJ SOLN
6.2500 mg | INTRAMUSCULAR | Status: DC | PRN
  Administered 2018-01-05: 6.25 mg via INTRAVENOUS

## 2018-01-05 MED ORDER — BUPIVACAINE LIPOSOME 1.3 % IJ SUSP
20.0000 mL | Freq: Once | INTRAMUSCULAR | Status: DC
  Filled 2018-01-05: qty 20

## 2018-01-05 MED ORDER — PROPOFOL 10 MG/ML IV BOLUS
INTRAVENOUS | Status: DC | PRN
  Administered 2018-01-05: 150 mg via INTRAVENOUS

## 2018-01-05 MED ORDER — HEMOSTATIC AGENTS (NO CHARGE) OPTIME
TOPICAL | Status: DC | PRN
  Administered 2018-01-05 (×2): 1 via TOPICAL

## 2018-01-05 MED ORDER — METHYLENE BLUE 0.5 % INJ SOLN
INTRAVENOUS | Status: DC | PRN
  Administered 2018-01-05: 1 mL

## 2018-01-05 MED ORDER — PROMETHAZINE HCL 25 MG/ML IJ SOLN
INTRAMUSCULAR | Status: AC
  Filled 2018-01-05: qty 1

## 2018-01-05 MED ORDER — BUPIVACAINE LIPOSOME 1.3 % IJ SUSP
INTRAMUSCULAR | Status: DC | PRN
  Administered 2018-01-05: 10 mL

## 2018-01-05 MED ORDER — SUGAMMADEX SODIUM 200 MG/2ML IV SOLN
INTRAVENOUS | Status: AC
  Filled 2018-01-05: qty 2

## 2018-01-05 MED ORDER — PHENYLEPHRINE 40 MCG/ML (10ML) SYRINGE FOR IV PUSH (FOR BLOOD PRESSURE SUPPORT)
PREFILLED_SYRINGE | INTRAVENOUS | Status: AC
  Filled 2018-01-05: qty 10

## 2018-01-05 MED ORDER — THROMBIN 5000 UNITS EX SOLR
CUTANEOUS | Status: DC | PRN
  Administered 2018-01-05 (×3): 5000 [IU] via TOPICAL

## 2018-01-05 SURGICAL SUPPLY — 66 items
BENZOIN TINCTURE PRP APPL 2/3 (GAUZE/BANDAGES/DRESSINGS) ×2 IMPLANT
BUR ROUND PRECISION 4.0 (BURR) ×2 IMPLANT
CANISTER SUCT 3000ML PPV (MISCELLANEOUS) ×2 IMPLANT
CARTRIDGE OIL MAESTRO DRILL (MISCELLANEOUS) ×1 IMPLANT
CORDS BIPOLAR (ELECTRODE) ×2 IMPLANT
DIFFUSER DRILL AIR PNEUMATIC (MISCELLANEOUS) ×2 IMPLANT
DRAIN CHANNEL 15F RND FF W/TCR (WOUND CARE) IMPLANT
DRAPE POUCH INSTRU U-SHP 10X18 (DRAPES) ×2 IMPLANT
DRAPE SURG 17X23 STRL (DRAPES) ×8 IMPLANT
DURAPREP 26ML APPLICATOR (WOUND CARE) ×2 IMPLANT
ELECT BLADE 4.0 EZ CLEAN MEGAD (MISCELLANEOUS) ×2
ELECT CAUTERY BLADE 6.4 (BLADE) ×2 IMPLANT
ELECT REM PT RETURN 9FT ADLT (ELECTROSURGICAL) ×2
ELECTRODE BLDE 4.0 EZ CLN MEGD (MISCELLANEOUS) ×1 IMPLANT
ELECTRODE REM PT RTRN 9FT ADLT (ELECTROSURGICAL) ×1 IMPLANT
EVACUATOR SILICONE 100CC (DRAIN) IMPLANT
FILTER STRAW FLUID ASPIR (MISCELLANEOUS) ×2 IMPLANT
GAUZE SPONGE 4X4 12PLY STRL (GAUZE/BANDAGES/DRESSINGS) ×2 IMPLANT
GAUZE SPONGE 4X4 16PLY XRAY LF (GAUZE/BANDAGES/DRESSINGS) ×4 IMPLANT
GLOVE BIO SURGEON STRL SZ7 (GLOVE) ×4 IMPLANT
GLOVE BIO SURGEON STRL SZ8 (GLOVE) ×2 IMPLANT
GLOVE BIOGEL PI IND STRL 7.0 (GLOVE) ×1 IMPLANT
GLOVE BIOGEL PI IND STRL 8 (GLOVE) ×1 IMPLANT
GLOVE BIOGEL PI INDICATOR 7.0 (GLOVE) ×1
GLOVE BIOGEL PI INDICATOR 8 (GLOVE) ×1
GOWN STRL REUS W/ TWL LRG LVL3 (GOWN DISPOSABLE) ×1 IMPLANT
GOWN STRL REUS W/ TWL XL LVL3 (GOWN DISPOSABLE) ×2 IMPLANT
GOWN STRL REUS W/TWL LRG LVL3 (GOWN DISPOSABLE) ×1
GOWN STRL REUS W/TWL XL LVL3 (GOWN DISPOSABLE) ×2
IV CATH 14GX2 1/4 (CATHETERS) ×2 IMPLANT
KIT BASIN OR (CUSTOM PROCEDURE TRAY) ×2 IMPLANT
KIT POSITION SURG JACKSON T1 (MISCELLANEOUS) ×2 IMPLANT
KIT TURNOVER KIT B (KITS) ×2 IMPLANT
NEEDLE 18GX1X1/2 (RX/OR ONLY) (NEEDLE) ×2 IMPLANT
NEEDLE 22X1 1/2 (OR ONLY) (NEEDLE) ×2 IMPLANT
NEEDLE HYPO 25GX1X1/2 BEV (NEEDLE) ×2 IMPLANT
NEEDLE SPNL 18GX3.5 QUINCKE PK (NEEDLE) ×4 IMPLANT
NS IRRIG 1000ML POUR BTL (IV SOLUTION) ×2 IMPLANT
OIL CARTRIDGE MAESTRO DRILL (MISCELLANEOUS) ×2
PACK LAMINECTOMY ORTHO (CUSTOM PROCEDURE TRAY) ×2 IMPLANT
PACK UNIVERSAL I (CUSTOM PROCEDURE TRAY) ×2 IMPLANT
PAD ARMBOARD 7.5X6 YLW CONV (MISCELLANEOUS) ×6 IMPLANT
PATTIES SURGICAL .5 X.5 (GAUZE/BANDAGES/DRESSINGS) IMPLANT
PATTIES SURGICAL .5 X1 (DISPOSABLE) ×2 IMPLANT
SPONGE INTESTINAL PEANUT (DISPOSABLE) ×2 IMPLANT
SPONGE SURGIFOAM ABS GEL 100 (HEMOSTASIS) IMPLANT
SPONGE SURGIFOAM ABS GEL SZ50 (HEMOSTASIS) ×2 IMPLANT
STRIP CLOSURE SKIN 1/2X4 (GAUZE/BANDAGES/DRESSINGS) ×2 IMPLANT
SURGIFLO W/THROMBIN 8M KIT (HEMOSTASIS) ×2 IMPLANT
SUT MNCRL AB 4-0 PS2 18 (SUTURE) ×2 IMPLANT
SUT VIC AB 0 CT1 18XCR BRD 8 (SUTURE) IMPLANT
SUT VIC AB 0 CT1 27 (SUTURE)
SUT VIC AB 0 CT1 27XBRD ANBCTR (SUTURE) IMPLANT
SUT VIC AB 0 CT1 8-18 (SUTURE)
SUT VIC AB 1 CT1 18XCR BRD 8 (SUTURE) ×1 IMPLANT
SUT VIC AB 1 CT1 8-18 (SUTURE) ×1
SUT VIC AB 2-0 CT2 18 VCP726D (SUTURE) ×2 IMPLANT
SYR 20CC LL (SYRINGE) ×2 IMPLANT
SYR BULB IRRIGATION 50ML (SYRINGE) ×2 IMPLANT
SYR CONTROL 10ML LL (SYRINGE) ×4 IMPLANT
SYR TB 1ML 26GX3/8 SAFETY (SYRINGE) ×4 IMPLANT
SYR TB 1ML LUER SLIP (SYRINGE) ×4 IMPLANT
TAPE CLOTH SURG 4X10 WHT LF (GAUZE/BANDAGES/DRESSINGS) ×2 IMPLANT
TOWEL OR 17X24 6PK STRL BLUE (TOWEL DISPOSABLE) ×2 IMPLANT
TOWEL OR 17X26 10 PK STRL BLUE (TOWEL DISPOSABLE) ×2 IMPLANT
YANKAUER SUCT BULB TIP NO VENT (SUCTIONS) ×2 IMPLANT

## 2018-01-05 NOTE — Transfer of Care (Signed)
Immediate Anesthesia Transfer of Care Note  Patient: Sarah Krause  Procedure(s) Performed: RIGHT SIDED LUMBAR FIVE - SACRUM ONE MICRODISECTOMY (Right Spine Lumbar)  Patient Location: PACU  Anesthesia Type:General  Level of Consciousness: awake, alert , oriented and patient cooperative  Airway & Oxygen Therapy: Patient Spontanous Breathing and Patient connected to nasal cannula oxygen  Post-op Assessment: Report given to RN, Post -op Vital signs reviewed and stable and Patient moving all extremities X 4  Post vital signs: Reviewed and stable  Last Vitals:  Vitals Value Taken Time  BP 133/92 01/05/2018  5:58 PM  Temp    Pulse 75 01/05/2018  5:59 PM  Resp 12 01/05/2018  5:59 PM  SpO2 98 % 01/05/2018  5:59 PM  Vitals shown include unvalidated device data.  Last Pain:  Vitals:   01/05/18 1205  TempSrc:   PainSc: 2       Patients Stated Pain Goal: 1 (72/07/21 8288)  Complications: No apparent anesthesia complications

## 2018-01-05 NOTE — H&P (Signed)
PREOPERATIVE H&P  Chief Complaint: Right leg pain  HPI: Sarah Krause is a 52 y.o. female who presents with ongoing pain in the right leg x 3 months  MRI reveals a right L5/S1 HNP  Patient has failed multiple forms of conservative care and continues to have pain (see office notes for additional details regarding the patient's full course of treatment)  Past Medical History:  Diagnosis Date  . Arthritis   . Family history of adverse reaction to anesthesia    mother hard time coming out of anesthia-("she's tiny"  . Headache   . History of kidney stones    passed   Past Surgical History:  Procedure Laterality Date  . ABDOMINAL HYSTERECTOMY    . CHOLECYSTECTOMY    . COLONOSCOPY     has had 2  as 6//5/19  . INCONTINENCE SURGERY    . Varicose veins     Social History   Socioeconomic History  . Marital status: Married    Spouse name: Not on file  . Number of children: Not on file  . Years of education: Not on file  . Highest education level: Not on file  Occupational History  . Not on file  Social Needs  . Financial resource strain: Not on file  . Food insecurity:    Worry: Not on file    Inability: Not on file  . Transportation needs:    Medical: Not on file    Non-medical: Not on file  Tobacco Use  . Smoking status: Never Smoker  . Smokeless tobacco: Never Used  Substance and Sexual Activity  . Alcohol use: Yes    Comment: "May 1 a Month"  . Drug use: No  . Sexual activity: Not on file  Lifestyle  . Physical activity:    Days per week: Not on file    Minutes per session: Not on file  . Stress: Not on file  Relationships  . Social connections:    Talks on phone: Not on file    Gets together: Not on file    Attends religious service: Not on file    Active member of club or organization: Not on file    Attends meetings of clubs or organizations: Not on file    Relationship status: Not on file  Other Topics Concern  . Not on file  Social  History Narrative  . Not on file   Family History  Problem Relation Age of Onset  . Heart disease Father   . Diabetes Brother    Allergies  Allergen Reactions  . Almond Oil Anaphylaxis and Hives  . Other Swelling     ALMONDS  . Sulfa Antibiotics Hives   Prior to Admission medications   Medication Sig Start Date End Date Taking? Authorizing Provider  meloxicam (MOBIC) 15 MG tablet Take 1 tablet (15 mg total) by mouth daily. Patient not taking: Reported on 01/03/2018 07/22/17   Evelina Bucy, DPM     All other systems have been reviewed and were otherwise negative with the exception of those mentioned in the HPI and as above.  Physical Exam: There were no vitals filed for this visit.  Body mass index is 32.61 kg/m.  General: Alert, no acute distress Cardiovascular: No pedal edema Respiratory: No cyanosis, no use of accessory musculature Skin: No lesions in the area of chief complaint Neurologic: Sensation intact distally Psychiatric: Patient is competent for consent with normal mood and affect Lymphatic: No axillary or cervical lymphadenopathy  MUSCULOSKELETAL: + SLR on the right  Assessment/Plan: RIGHT LEG PAIN Plan for Procedure(s): RIGHT SIDED LUMBAR 5 - SACRUM 1 MICRODISECTOMY.  TIME REQUESTED 2.5 HOURS   Sinclair Ship, MD 01/05/2018 7:17 AM

## 2018-01-05 NOTE — Op Note (Signed)
NAME:  Sarah Krause          MEDICAL RECORD NO.:  [id number]  PHYSICIAN:  Phylliss Bob, MD      DATE OF BIRTH:  10/18/2019  DATE OF PROCEDURE:  01/05/2018                              OPERATIVE REPORT   PREOPERATIVE DIAGNOSES: 1. Right-sided S1 radiculopathy. 2. 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  POSTOPERATIVE DIAGNOSES: 1. Right-sided S1 radiculopathy. 2. 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  PROCEDURES:  Right-sided L5-S1 laminotomy with partial facetectomy and removal of large herniated right-sided L5-S1 disk fragment.  SURGEON:  Phylliss Bob, MD.  ASSISTANTPricilla Holm, PA-C.  ANESTHESIA:  General endotracheal anesthesia.  COMPLICATIONS:  None.  DISPOSITION:  Stable.  ESTIMATED BLOOD LOSS:  Minimal.  INDICATIONS FOR SURGERY:  Briefly, Sarah Krause is a pleasant 52 year old female, who did present to me with severe pain in the right leg.  The patient's MRI did reveal the findings outlined above, clearly notable for a large herniated disk fragment. The pain was rather severe.  We did discuss treatment options and we did ultimately elect to proceed with the procedure reflected above.  The patient was fully made aware of the risks of surgery, including the risk of recurrent herniation and the need for subsequent surgery, including the possibility of a subsequent diskectomy and/or fusion.  OPERATIVE DETAILS:  On 01/05/2018, the patient was brought to surgery and general endotracheal anesthesia was administered.  The patient was placed prone on a well-padded flat Jackson bed with a spinal frame.  Antibiotics were given.  The back was prepped and draped and a time-out procedure was performed.  At this point, a midline incision was made directly over the L5-S1 intervertebral space.  A curvilinear incision was made just to the right of the  midline into the fascia.  A self-retaining McCulloch retractor was placed.  The lamina of L5 and S1 was identified and subperiosteally exposed.  I then removed the lateral aspect of the L5-S1 ligamentum flavum.  Readily identified was the traversing right S1 nerve, which was noted to be under obvious tension, and was noted to be rather erythematous.  I was able to gently gain medial retraction of the nerve, and in doing so, a very large herniated disk fragment was readily noted.  The herniation was significantly migrated up behind the L5 vertebral body, compressing both the right S1 nerve and the dural sac.  With gentle medial retraction of the right S1 nerve, I was able to remove the entirety of the herniated disc fragments in multiple fragments.  I was very pleased with the final decompression that I was able to accomplish.    At the termination of the decompression, the right S1 nerve was entirely free of compression and was mobile.  At this point, the wound was copiously irrigated with normal saline.  All epidural bleeding was controlled using bipolar electrocautery in addition to FloSeal. All bleeding was controlled at the termination of the procedure.  At this point, 20 mg of Depo-Medrol was introduced about the epidural space in the region of the right S1 nerve.  The wound was then closed in layers using #1 Vicryl followed by 0 Vicryl, followed by 4-0 Monocryl. Benzoin and Steri-Strips were applied followed by  a sterile dressing. All instrument counts were correct at the termination of the procedure.  Of note, Pricilla Holm was my assistant throughout surgery, and did aid in retraction, suctioning, and closure from start to finish.     Phylliss Bob, MD

## 2018-01-05 NOTE — Discharge Instructions (Signed)
General Anesthesia, Adult  General anesthesia is the use of medicines to make a person "go to sleep" (be unconscious) for a medical procedure. General anesthesia is often recommended when a procedure:   Is long.   Requires you to be still or in an unusual position.   Is major and can cause you to lose blood.   Is impossible to do without general anesthesia.    The medicines used for general anesthesia are called general anesthetics. In addition to making you sleep, the medicines:   Prevent pain.   Control your blood pressure.   Relax your muscles.    Tell a health care provider about:   Any allergies you have.   All medicines you are taking, including vitamins, herbs, eye drops, creams, and over-the-counter medicines.   Any problems you or family members have had with anesthetic medicines.   Types of anesthetics you have had in the past.   Any bleeding disorders you have.   Any surgeries you have had.   Any medical conditions you have.   Any history of heart or lung conditions, such as heart failure, sleep apnea, or chronic obstructive pulmonary disease (COPD).   Whether you are pregnant or may be pregnant.   Whether you use tobacco, alcohol, marijuana, or street drugs.   Any history of military service.   Any history of depression or anxiety.  What are the risks?  Generally, this is a safe procedure. However, problems may occur, including:   Allergic reaction to anesthetics.   Lung and heart problems.   Inhaling food or liquids from your stomach into your lungs (aspiration).   Injury to nerves.   Waking up during your procedure and being unable to move (rare).   Extreme agitation or a state of mental confusion (delirium) when you wake up from the anesthetic.   Air in the bloodstream, which can lead to stroke.    These problems are more likely to develop if you are having a major surgery or if you have an advanced medical condition. You can prevent some of these complications by answering  all of your health care provider's questions thoroughly and by following all pre-procedure instructions.  General anesthesia can cause side effects, including:   Nausea or vomiting   A sore throat from the breathing tube.   Feeling cold or shivery.   Feeling tired, washed out, or achy.   Sleepiness or drowsiness.   Confusion or agitation.    What happens before the procedure?  Staying hydrated  Follow instructions from your health care provider about hydration, which may include:   Up to 2 hours before the procedure - you may continue to drink clear liquids, such as water, clear fruit juice, black coffee, and plain tea.    Eating and drinking restrictions  Follow instructions from your health care provider about eating and drinking, which may include:   8 hours before the procedure - stop eating heavy meals or foods such as meat, fried foods, or fatty foods.   6 hours before the procedure - stop eating light meals or foods, such as toast or cereal.   6 hours before the procedure - stop drinking milk or drinks that contain milk.   2 hours before the procedure - stop drinking clear liquids.    Medicines   Ask your health care provider about:  ? Changing or stopping your regular medicines. This is especially important if you are taking diabetes medicines or blood thinners.  ? Taking   medicines such as aspirin and ibuprofen. These medicines can thin your blood. Do not take these medicines before your procedure if your health care provider instructs you not to.  ? Taking new dietary supplements or medicines. Do not take these during the week before your procedure unless your health care provider approves them.   If you are told to take a medicine or to continue taking a medicine on the day of the procedure, take the medicine with sips of water.  General instructions     Ask if you will be going home the same day, the following day, or after a longer hospital stay.  ? Plan to have someone take you  home.  ? Plan to have someone stay with you for the first 24 hours after you leave the hospital or clinic.   For 3-6 weeks before the procedure, try not to use any tobacco products, such as cigarettes, chewing tobacco, and e-cigarettes.   You may brush your teeth on the morning of the procedure, but make sure to spit out the toothpaste.  What happens during the procedure?   You will be given anesthetics through a mask and through an IV tube in one of your veins.   You may receive medicine to help you relax (sedative).   As soon as you are asleep, a breathing tube may be used to help you breathe.   An anesthesia specialist will stay with you throughout the procedure. He or she will help keep you comfortable and safe by continuing to give you medicines and adjusting the amount of medicine that you get. He or she will also watch your blood pressure, pulse, and oxygen levels to make sure that the anesthetics do not cause any problems.   If a breathing tube was used to help you breathe, it will be removed before you wake up.  The procedure may vary among health care providers and hospitals.  What happens after the procedure?   You will wake up, often slowly, after the procedure is complete, usually in a recovery area.   Your blood pressure, heart rate, breathing rate, and blood oxygen level will be monitored until the medicines you were given have worn off.   You may be given medicine to help you calm down if you feel anxious or agitated.   If you will be going home the same day, your health care provider may check to make sure you can stand, drink, and urinate.   Your health care providers will treat your pain and side effects before you go home.   Do not drive for 24 hours if you received a sedative.   You may:  ? Feel nauseous and vomit.  ? Have a sore throat.  ? Have mental slowness.  ? Feel cold or shivery.  ? Feel sleepy.  ? Feel tired.  ? Feel sore or achy, even in parts of your body where you did  not have surgery.  This information is not intended to replace advice given to you by your health care provider. Make sure you discuss any questions you have with your health care provider.  Document Released: 10/27/2007 Document Revised: 12/31/2015 Document Reviewed: 07/04/2015  Elsevier Interactive Patient Education  2018 [employer]

## 2018-01-05 NOTE — Anesthesia Procedure Notes (Addendum)
Procedure Name: Intubation Date/Time: 01/05/2018 4:00 PM Performed by: Renato Shin, CRNA Pre-anesthesia Checklist: Patient identified, Emergency Drugs available, Suction available and Patient being monitored Patient Re-evaluated:Patient Re-evaluated prior to induction Oxygen Delivery Method: Circle system utilized Preoxygenation: Pre-oxygenation with 100% oxygen Induction Type: IV induction Ventilation: Mask ventilation without difficulty and Oral airway inserted - appropriate to patient size Laryngoscope Size: Miller and 3 Grade View: Grade I Tube type: Oral Tube size: 7.5 mm Number of attempts: 1 Airway Equipment and Method: Stylet Placement Confirmation: ETT inserted through vocal cords under direct vision,  positive ETCO2,  CO2 detector and breath sounds checked- equal and bilateral Secured at: 21 cm Tube secured with: Tape Dental Injury: Teeth and Oropharynx as per pre-operative assessment

## 2018-01-05 NOTE — Anesthesia Preprocedure Evaluation (Signed)
Anesthesia Evaluation  Patient identified by MRN, date of birth, ID band Patient awake    Reviewed: Allergy & Precautions, NPO status , Patient's Chart, lab work & pertinent test results  Airway Mallampati: II  TM Distance: >3 FB Neck ROM: Full    Dental no notable dental hx.    Pulmonary neg pulmonary ROS,    Pulmonary exam normal breath sounds clear to auscultation       Cardiovascular + Peripheral Vascular Disease  Normal cardiovascular exam Rhythm:Regular Rate:Normal     Neuro/Psych  Headaches, negative psych ROS   GI/Hepatic negative GI ROS, Neg liver ROS,   Endo/Other  negative endocrine ROS  Renal/GU negative Renal ROS     Musculoskeletal  (+) Arthritis ,   Abdominal   Peds  Hematology negative hematology ROS (+)   Anesthesia Other Findings   Reproductive/Obstetrics negative OB ROS                             Anesthesia Physical Anesthesia Plan  ASA: II  Anesthesia Plan: General   Post-op Pain Management:    Induction: Intravenous  PONV Risk Score and Plan: 2 and Dexamethasone and Ondansetron  Airway Management Planned: Oral ETT  Additional Equipment:   Intra-op Plan:   Post-operative Plan: Extubation in OR  Informed Consent: I have reviewed the patients History and Physical, chart, labs and discussed the procedure including the risks, benefits and alternatives for the proposed anesthesia with the patient or authorized representative who has indicated his/her understanding and acceptance.   Dental advisory given  Plan Discussed with: CRNA  Anesthesia Plan Comments:        Anesthesia Quick Evaluation

## 2018-01-06 ENCOUNTER — Encounter (HOSPITAL_COMMUNITY): Payer: Self-pay | Admitting: Orthopedic Surgery

## 2018-01-06 NOTE — Anesthesia Postprocedure Evaluation (Signed)
Anesthesia Post Note  Patient: Sarah Krause  Procedure(s) Performed: RIGHT SIDED LUMBAR FIVE - SACRUM ONE MICRODISECTOMY (Right Spine Lumbar)     Patient location during evaluation: PACU Anesthesia Type: General Level of consciousness: awake and alert Pain management: pain level controlled Vital Signs Assessment: post-procedure vital signs reviewed and stable Respiratory status: spontaneous breathing, nonlabored ventilation, respiratory function stable and patient connected to nasal cannula oxygen Cardiovascular status: blood pressure returned to baseline and stable Postop Assessment: no apparent nausea or vomiting Anesthetic complications: no    Last Vitals:  Vitals:   01/05/18 1930 01/05/18 1945  BP:    Pulse: 69 76  Resp:    Temp:    SpO2: 95% 99%    Last Pain:  Vitals:   01/05/18 1830  TempSrc:   PainSc: Asleep   Pain Goal: Patients Stated Pain Goal: 1 (01/05/18 1205)               Tiajuana Amass

## 2018-01-19 DIAGNOSIS — Z9889 Other specified postprocedural states: Secondary | ICD-10-CM | POA: Diagnosis not present

## 2018-02-14 DIAGNOSIS — Z9889 Other specified postprocedural states: Secondary | ICD-10-CM | POA: Diagnosis not present

## 2018-02-15 DIAGNOSIS — M545 Low back pain: Secondary | ICD-10-CM | POA: Diagnosis not present

## 2018-02-15 DIAGNOSIS — M5126 Other intervertebral disc displacement, lumbar region: Secondary | ICD-10-CM | POA: Diagnosis not present

## 2018-02-23 DIAGNOSIS — M5126 Other intervertebral disc displacement, lumbar region: Secondary | ICD-10-CM | POA: Diagnosis not present

## 2018-02-23 DIAGNOSIS — M545 Low back pain: Secondary | ICD-10-CM | POA: Diagnosis not present

## 2018-03-02 DIAGNOSIS — M5126 Other intervertebral disc displacement, lumbar region: Secondary | ICD-10-CM | POA: Diagnosis not present

## 2018-03-02 DIAGNOSIS — M545 Low back pain: Secondary | ICD-10-CM | POA: Diagnosis not present

## 2019-05-17 DIAGNOSIS — J0110 Acute frontal sinusitis, unspecified: Secondary | ICD-10-CM | POA: Diagnosis not present

## 2019-08-10 ENCOUNTER — Other Ambulatory Visit (HOSPITAL_COMMUNITY): Payer: Self-pay | Admitting: Family Medicine

## 2019-08-10 ENCOUNTER — Ambulatory Visit
Admission: RE | Admit: 2019-08-10 | Discharge: 2019-08-10 | Disposition: A | Discharge status: Home / Self Care | Payer: Self-pay | Source: Ambulatory Visit | Attending: Family Medicine | Admitting: Family Medicine

## 2019-08-10 ENCOUNTER — Inpatient Hospital Stay
Admission: RE | Admit: 2019-08-10 | Discharge: 2019-08-10 | Disposition: A | Discharge status: Home / Self Care | Payer: Self-pay | Source: Ambulatory Visit | Attending: Family Medicine | Admitting: Family Medicine

## 2019-08-10 DIAGNOSIS — C801 Malignant (primary) neoplasm, unspecified: Secondary | ICD-10-CM

## 2019-08-14 ENCOUNTER — Ambulatory Visit (INDEPENDENT_AMBULATORY_CARE_PROVIDER_SITE_OTHER): Payer: BC Managed Care – PPO | Admitting: Family Medicine

## 2019-08-14 ENCOUNTER — Other Ambulatory Visit: Payer: Self-pay

## 2019-08-14 ENCOUNTER — Encounter: Payer: Self-pay | Admitting: Family Medicine

## 2019-08-14 VITALS — BP 142/95 | HR 66 | Temp 98.2°F | Resp 18 | Ht 64.0 in | Wt 203.2 lb

## 2019-08-14 DIAGNOSIS — M7072 Other bursitis of hip, left hip: Secondary | ICD-10-CM

## 2019-08-14 DIAGNOSIS — M1611 Unilateral primary osteoarthritis, right hip: Secondary | ICD-10-CM | POA: Diagnosis not present

## 2019-08-14 DIAGNOSIS — M1711 Unilateral primary osteoarthritis, right knee: Secondary | ICD-10-CM | POA: Diagnosis not present

## 2019-08-14 DIAGNOSIS — M25559 Pain in unspecified hip: Secondary | ICD-10-CM | POA: Diagnosis not present

## 2019-08-14 DIAGNOSIS — M5136 Other intervertebral disc degeneration, lumbar region: Secondary | ICD-10-CM

## 2019-08-14 DIAGNOSIS — M47816 Spondylosis without myelopathy or radiculopathy, lumbar region: Secondary | ICD-10-CM

## 2019-08-14 DIAGNOSIS — M7071 Other bursitis of hip, right hip: Secondary | ICD-10-CM | POA: Diagnosis not present

## 2019-08-14 DIAGNOSIS — Z0000 Encounter for general adult medical examination without abnormal findings: Secondary | ICD-10-CM

## 2019-08-14 MED ORDER — MELOXICAM 15 MG PO TABS: 15.0000 mg | ORAL_TABLET | Freq: Every day | ORAL | 1 refills | Status: DC

## 2019-08-14 NOTE — Progress Notes (Signed)
   Patient ID: Sarah Krause, female  DOB: 11/24/1965, 54 y.o.   MRN: [id number] Patient Care Team    Relationship Specialty Notifications Start End  Kuneff, Renee A, DO PCP - General Family Medicine  08/14/19   Jacobs, Bradley, MD  Obstetrics and Gynecology  08/17/19   Dumonski, Mark, MD Consulting Physician Orthopedic Surgery  08/17/19   Lawson, Jeffrey Harold, MD Referring Physician Vascular Surgery  08/17/19   Price, Michael J, DPM Consulting Physician Podiatry  08/17/19     Chief Complaint  Patient presents with  . Establish Care  . Hip Pain    More of the right hip than the left     Subjective:  Sarah Krause is a 54 y.o.  female present for new patient establishment. All past medical history, surgical history, allergies, family history, immunizations, medications and social history were updated in the electronic medical record today. All recent labs, ED visits and hospitalizations within the last year were reviewed.  Bilateral hip pain: Patient presents today for new patient establishment with complaints of bilateral hip pain.  She states the right side of her lateral hip is worse than the left side, but there is discomfort bilaterally.  Patient has a history per prior x-rays reviewed 11/09/2017 of mild bilateral hip degenerative changes at that time.  Patient has underwent L5-S1 microdiscectomy in the past for lumbar degenerative disc disease.  She does not feel her pain is consistent with the pain she experienced from her lower lumbar degenerative disease.  She also endorses bilateral knee discomfort.  She states she knows she has arthritis in her knees and is wondering if wearing a daily compression dressing on her knees would be helpful. She has been tried on cyclobenzaprine and gabapentin for her lower back symptoms in 2019 and reports it was not effective.  She had also been prescribed diclofenac at that time she does not recall why this was stopped.  She currently is not  taking any daily medications for her pain and arthritis, she does use Goody powders frequently-Which she reports can be helpful. 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  She is on her feet and walking the majority of her day.  Depression screen PHQ 2/9 08/14/2019  Decreased Interest 0  Down, Depressed, Hopeless 0  PHQ - 2 Score 0   No flowsheet data found.      Fall Risk  08/14/2019  Falls in the past year? 0   Immunization History  Administered Date(s) Administered  . Influenza,inj,Quad PF,6+ Mos 04/04/2019  . Influenza-Unspecified 06/03/2014  . MMR 01/01/1969    No exam data present  Past Medical History:  Diagnosis Date  . Arthritis   . Diverticulitis   . Dry eyes   . Family history of adverse reaction to anesthesia    mother hard time coming out of anesthia-("she's tiny"  . Hemorrhoids   . 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  . History of kidney stones    passed  . Migraines   . Precancerous skin lesion    Abdomen and arm.   Allergies  Allergen Reactions  . Almond Oil Anaphylaxis and Hives  . Other Swelling     ALMONDS  . Sulfa Antibiotics Hives   Past Surgical History:    Procedure Laterality Date  . ABDOMINAL HYSTERECTOMY  2006   Partial hysterectomy with bladder tact  . Bladder mesh  2016   Placement of "bladder mesh "  . CHOLECYSTECTOMY  2004  . COLONOSCOPY     has had 2  as 6//5/19  . DG right hip xray:   11/09/2017   No acute fracture or malalignment.  Mild bilateral hip degenerative changes.  Mild degenerative disc and facet disease of the lower lumbar spine.  . image CT abd/pelvis  09/07/2014   Rectosigmoid colonic wall thickening with adjacent fat stranding compatible with diverticulitis.  Mild common duct dilatation in the setting of  prior cholecystectomy, felt to be incidental and likely at the upper end of normal range considering no right upper quadrant pain at the time.  . Image MRI:  12/02/2017   L 2-3 mild facet joint arthritis.  L3-4 mild facet joint arthritis.  L4-5 mild facet joint arthritis.  L5-S1 large disc extrusion on the right with compression of the right side of the thecal sac and the right S1 nerve.  . INCONTINENCE SURGERY    . LUMBAR LAMINECTOMY/DECOMPRESSION MICRODISCECTOMY Right 01/05/2018   Procedure: RIGHT SIDED LUMBAR FIVE - SACRUM ONE MICRODISECTOMY;  Surgeon: Dumonski, Mark, MD;  Location: [facility] OR;  Service: Orthopedics;  Laterality: Right;  . TONSILLECTOMY  1971  . Varicose veins Right 2016   March & June 2016.  . xray knee Right 04/19/2011   Mild patellofemoral and medial compartment osteoarthritis.  Tiny cortical buckle in the posterior lateral tibial plateau-normal variant.   Family History  Problem Relation Age of Onset  . Arthritis Mother   . Colitis Mother   . Macular degeneration Mother   . Heart disease Father   . Heart attack Father        died 1978  . Diabetes Brother   . Arthritis Brother   . Hyperlipidemia Brother   . Macular degeneration Brother   . Ulcerative colitis Sister   . Parkinson's disease Maternal Grandfather   . Parkinson's disease Brother    Social History   Social History Narrative   Marital status/children/pets: Married.  2 children.   Education/employment: Bachelor's in science.  Employed as a registrar.   Safety:      -Wears a bicycle helmet riding a bike: Yes     -smoke alarm in the home:Yes     - wears seatbelt: Yes     - Feels safe in their relationships: Yes    Allergies as of 08/14/2019      Reactions   Almond Oil Anaphylaxis, Hives   Other Swelling   ALMONDS   Sulfa Antibiotics Hives      Medication List       Accurate as of August 14, 2019 11:59 PM. If you have any questions, ask your nurse or doctor.        STOP taking these  medications   aspirin-acetaminophen-caffeine 250-250-65 MG tablet Commonly known as: EXCEDRIN MIGRAINE Stopped by: Renee Kuneff, DO     TAKE these medications   CALCIUM 500 +D PO Calcium 500   meloxicam 15 MG tablet Commonly known as: MOBIC Take 1 tablet (15 mg total) by mouth daily. Started by: Renee Kuneff, DO       All past medical history, surgical history, allergies, family history, immunizations andmedications were updated in the EMR today and reviewed under the history and medication portions of their EMR.    No results found for this or any previous visit (from the   past 2160 hour(s)).  No results found.   ROS: 14 pt review of systems performed and negative (unless mentioned in an HPI)  Objective: BP (!) 142/95 (BP Location: Left Arm, Patient Position: Sitting)   Pulse 66   Temp 98.2 F (36.8 C) (Temporal)   Resp 18   Ht 5' 4" (1.626 m)   Wt 203 lb 3.2 oz (92.2 kg)   SpO2 98%   BMI 34.88 kg/m  Gen: Afebrile. No acute distress. Nontoxic in appearance, well-developed, well-nourished, pleasant Caucasian female HENT: AT. [place]. Eyes:Pupils Equal Round Reactive to light, Extraocular movements intact,  Conjunctiva without redness, discharge or icterus. CV: RRR. Skin: No rashes, purpura or petechiae.  No bruising.  Warm and well-perfused. Skin intact. Neuro/Msk: Mild limp.Meredith Pel. EOMi. Alert. Oriented x3.  Mild mottling appearance over bilateral lateral hips. no lumbar bony tenderness.  No SI tenderness bilaterally.  Tender over bony prominence of lateral right hip including upper limits of IT band.  Muscle strength 5/5 upper/lower extremity. Psych: Normal affect, dress and demeanor. Normal speech. Normal thought content and judgment.   Assessment/plan: Antoninette Lerner is a 54 y.o. female present for est care Hip pain/bursitis of bilateral hips/osteoarthritis-hip/lumbar/knee -Patient has a history of osteoarthritis of her right hip, lumbar spine and right knee per  prior x-rays reviewed in EMR.  She currently is not on a daily medication to help with her discomfort.  She is agreeable to try a daily medication.   -Start Mobic 15 mg daily with food. -Discouraged continued use of Goody powders. -Discussed t referral to sports med to help with arthritis in hips and consider injection for her hip bursitis.  She is agreeable to this approach today. - Ambulatory referral to Sports Medicine - Agree right knee compression would be helpful while on her feet daily. Follow-up in 6 months, sooner if symptoms are worsening.    Return in about 6 months (around 02/11/2020), or if symptoms worsen or fail to improve.   Note is dictated utilizing voice recognition software. Although note has been proof read prior to signing, occasional typographical errors still can be missed. If any questions arise, please do not hesitate to call for verification.  Electronically signed by: Howard Pouch, DO [place]

## 2019-08-14 NOTE — Patient Instructions (Addendum)
Start mobic once daily with food. This will help with bursitis and arthritis.  I will also refer you to sports meds.  Please to meet you today.     Hip Bursitis  Hip bursitis is swelling of a fluid-filled sac (bursa) in your hip joint. This swelling (inflammation) can be painful. This condition may come and go over time. What are the causes?  Injury to the hip.  Overuse of the muscles that surround the hip joint.  An earlier injury or surgery of the hip.  Arthritis or gout.  Diabetes.  Thyroid disease.  Infection.  In some cases, the cause may not be known. What are the signs or symptoms?  Mild or moderate pain in the hip area. Pain may get worse with movement.  Tenderness and swelling of the hip, especially on the outer side of the hip.  In rare cases, the bursa may become infected. This may cause: ? A fever. ? Warmth and redness in the area. Symptoms may come and go. How is this treated? This condition is treated by resting, icing, applying pressure (compression), and raising (elevating) the injured area. You may hear this called the RICE treatment. Treatment may also include:  Using crutches.  Draining fluid out of the bursa to help relieve swelling.  Giving a shot of (injecting) medicine that helps to reduce swelling (cortisone).  Other medicines if the bursa is infected. Follow these instructions at home: Managing pain, stiffness, and swelling   If told, put ice on the painful area. ? Put ice in a plastic bag. ? Place a towel between your skin and the bag. ? Leave the ice on for 20 minutes, 2-3 times a day. ? Raise (elevate) your hip above the level of your heart as much as you can without pain. To do this, try putting a pillow under your hips while you lie down. Stop if this causes pain. Activity  Return to your normal activities as told by your doctor. Ask your doctor what activities are safe for you.  Rest and protect your hip as much as you can  until you feel better. General instructions  Take over-the-counter and prescription medicines only as told by your doctor.  Wear wraps that put pressure on your hip (compression wraps) only as told by your doctor.  Do not use your hip to support your body weight until your doctor says that you can.  Use crutches as told by your doctor.  Gently rub and stretch your injured area as often as is comfortable.  Keep all follow-up visits as told by your doctor. This is important. How is this prevented?  Exercise regularly, as told by your doctor.  Warm up and stretch before being active.  Cool down and stretch after being active.  Avoid activities that bother your hip or cause pain.  Avoid sitting down for long periods at a time. Contact a doctor if:  You have a fever.  You get new symptoms.  You have trouble walking.  You have trouble doing everyday activities.  You have pain that gets worse.  You have pain that does not get better with medicine.  You get red skin on your hip area.  You get a feeling of warmth in your hip area. Get help right away if:  You cannot move your hip.  You have very bad pain. Summary  Hip bursitis is swelling of a fluid-filled sac (bursa) in your hip.  Hip bursitis can be painful.  Symptoms often come  and go over time.  This condition is treated with rest, ice, compression, elevation, and medicines. This information is not intended to replace advice given to you by your health care provider. Make sure you discuss any questions you have with your health care provider. Document Revised: 03/28/2018 Document Reviewed: 03/28/2018 Elsevier Patient Education  2020 [place], do it slowly. Contact a doctor if you:  Have a fever.  Have chills.  Have symptoms that do not get better with treatment or home care. Summary  Bursitis is when the fluid-filled sac (bursa) that covers and protects a joint is swollen.  Rest the  affected area as told by your doctor.  Avoid doing things that make the pain worse.  Put ice on the affected area as told by your doctor. This information is not intended to replace advice given to you by your health care provider. Make sure you discuss any questions you have with your health care provider. Document Revised: 07/02/2017 Document Reviewed: 06/04/2017 Elsevier Patient Education  [place].   Please help [place] help you:  We are honored you have chosen [facility] for your Primary Care home. Below you will find basic instructions that you may need to access in the future. Please help [place] help you by reading the instructions, which cover many of the frequent questions we experience.   Prescription refills and request:  -In order to allow more efficient response time, please call your pharmacy for all refills. They will forward the request electronically to [place]. This allows for the quickest possible response. Request left on a nurse line can take longer to refill, since these are checked as time allows between office patients and other phone calls.  - refill request can take up to 3-5 working days to complete.  - If request is sent electronically and request is appropiate, it is usually completed in 1-2 business days.  - all patients will need to be seen routinely for all chronic medical conditions requiring prescription medications (see follow-up below). If you are overdue for follow up on your condition, you will be asked to make an appointment and we will call in enough medication to cover you until your appointment (up to 30 days).  - all controlled substances will require a face to face visit to request/refill.  - if you desire your prescriptions to go through a new pharmacy, and have an active script at original pharmacy, you will need to call your pharmacy and have scripts transferred to new pharmacy. This is completed between the pharmacy locations and not by your  provider.    Results: If any images or labs were ordered, it can take up to 1 week to get results depending on the test ordered and the lab/facility running and resulting the test. - Normal or stable results, which do not need further discussion, may be released to your mychart immediately with attached note to you. A call may not be generated for normal results. Please make certain to sign up for mychart. If you have questions on how to activate your mychart you can call the front office.  - If your results need further discussion, our office will attempt to contact you via phone, and if unable to reach you after 2 attempts, we will release your abnormal result to your mychart with instructions.  - All results will be automatically released in mychart after 1 week.  - Your provider will provide you with explanation and instruction on all relevant  material in your results. Please keep in mind, results and labs may appear confusing or abnormal to the untrained eye, but it does not mean they are actually abnormal for you personally. If you have any questions about your results that are not covered, or you desire more detailed explanation than what was provided, you should make an appointment with your provider to do so.   Our office handles many outgoing and incoming calls daily. If we have not contacted you within 1 week about your results, please check your mychart to see if there is a message first and if not, then contact our office.  In helping with this matter, you help decrease call volume, and therefore allow [place] to be able to respond to patients needs more efficiently.   Acute office visits (sick visit):  An acute visit is intended for a new problem and are scheduled in shorter time slots to allow schedule openings for patients with new problems. This is the appropriate visit to discuss a new problem. Problems will not be addressed by phone call or Echart message. Appointment is needed if  requesting treatment. In order to provide you with excellent quality medical care with proper time for you to explain your problem, have an exam and receive treatment with instructions, these appointments should be limited to one new problem per visit. If you experience a new problem, in which you desire to be addressed, please make an acute office visit, we save openings on the schedule to accommodate you. Please do not save your new problem for any other type of visit, let us take care of it properly and quickly for you.   Follow up visits:  Depending on your condition(s) your provider will need to see you routinely in order to provide you with quality care and prescribe medication(s). Most chronic conditions (Example: hypertension, Diabetes, depression/anxiety... etc), require visits a couple times a year. Your provider will instruct you on proper follow up for your personal medical conditions and history. Please make certain to make follow up appointments for your condition as instructed. Failing to do so could result in lapse in your medication treatment/refills. If you request a refill, and are overdue to be seen on a condition, we will always provide you with a 30 day script (once) to allow you time to schedule.    Medicare wellness (well visit): - we have a wonderful Nurse Maudie Mercury), that will meet with you and provide you will yearly medicare wellness visits. These visits should occur yearly (can not be scheduled less than 1 calendar year apart) and cover preventive health, immunizations, advance directives and screenings you are entitled to yearly through your medicare benefits. Do not miss out on your entitled benefits, this is when medicare will pay for these benefits to be ordered for you.  These are strongly encouraged by your provider and is the appropriate type of visit to make certain you are up to date with all preventive health benefits. If you have not had your medicare wellness exam in the  last 12 months, please make certain to schedule one by calling the office and schedule your medicare wellness with Maudie Mercury as soon as possible.   Yearly physical (well visit):  - Adults are recommended to be seen yearly for physicals. Check with your insurance and date of your last physical, most insurances require one calendar year between physicals. Physicals include all preventive health topics, screenings, medical exam and labs that are appropriate for gender/age and history. You may  have fasting labs needed at this visit. This is a well visit (not a sick visit), new problems should not be covered during this visit (see acute visit).  - Pediatric patients are seen more frequently when they are younger. Your provider will advise you on well child visit timing that is appropriate for your their age. - This is not a medicare wellness visit. Medicare wellness exams do not have an exam portion to the visit. Some medicare companies allow for a physical, some do not allow a yearly physical. If your medicare allows a yearly physical you can schedule the medicare wellness with our nurse Maudie Mercury and have your physical with your provider after, on the same day. Please check with insurance for your full benefits.   Late Policy/No Shows:  - all new patients should arrive 15-30 minutes earlier than appointment to allow [place] time  to  obtain all personal demographics,  insurance information and for you to complete office paperwork. - All established patients should arrive 10-15 minutes earlier than appointment time to update all information and be checked in .  - In our best efforts to run on time, if you are late for your appointment you will be asked to either reschedule or if able, we will work you back into the schedule. There will be a wait time to work you back in the schedule,  depending on availability.  - If you are unable to make it to your appointment as scheduled, please call 24 hours ahead of time to allow [place] to  fill the time slot with someone else who needs to be seen. If you do not cancel your appointment ahead of time, you may be charged a no show fee.

## 2019-08-17 ENCOUNTER — Encounter: Payer: Self-pay | Admitting: Family Medicine

## 2019-08-17 DIAGNOSIS — M1611 Unilateral primary osteoarthritis, right hip: Secondary | ICD-10-CM | POA: Insufficient documentation

## 2019-08-17 DIAGNOSIS — N393 Stress incontinence (female) (male): Secondary | ICD-10-CM | POA: Insufficient documentation

## 2019-08-17 DIAGNOSIS — M7071 Other bursitis of hip, right hip: Secondary | ICD-10-CM | POA: Insufficient documentation

## 2019-08-17 DIAGNOSIS — M5136 Other intervertebral disc degeneration, lumbar region: Secondary | ICD-10-CM | POA: Insufficient documentation

## 2019-08-17 DIAGNOSIS — M1711 Unilateral primary osteoarthritis, right knee: Secondary | ICD-10-CM | POA: Insufficient documentation

## 2019-08-17 DIAGNOSIS — M47816 Spondylosis without myelopathy or radiculopathy, lumbar region: Secondary | ICD-10-CM | POA: Insufficient documentation

## 2019-08-17 DIAGNOSIS — M25559 Pain in unspecified hip: Secondary | ICD-10-CM | POA: Insufficient documentation

## 2019-08-21 ENCOUNTER — Encounter: Payer: Self-pay | Admitting: Family Medicine

## 2019-08-21 ENCOUNTER — Other Ambulatory Visit: Payer: Self-pay

## 2019-08-21 ENCOUNTER — Ambulatory Visit (INDEPENDENT_AMBULATORY_CARE_PROVIDER_SITE_OTHER): Payer: BC Managed Care – PPO | Admitting: Family Medicine

## 2019-08-21 VITALS — BP 132/82 | HR 64 | Ht 64.0 in | Wt 203.0 lb

## 2019-08-21 DIAGNOSIS — M25552 Pain in left hip: Secondary | ICD-10-CM

## 2019-08-21 DIAGNOSIS — M25551 Pain in right hip: Secondary | ICD-10-CM | POA: Diagnosis not present

## 2019-08-21 DIAGNOSIS — M7061 Trochanteric bursitis, right hip: Secondary | ICD-10-CM | POA: Diagnosis not present

## 2019-08-21 DIAGNOSIS — M7062 Trochanteric bursitis, left hip: Secondary | ICD-10-CM

## 2019-08-21 NOTE — Progress Notes (Signed)
   Subjective:    I'm seeing this patient as a consultation for:  Dr. Raoul Pitch. Note will be routed back to referring provider/PCP.  CC: B hip pain R>L  I, Molly Weber, LAT, ATC, am serving as scribe for Dr. Lynne Leader.  HPI: Pt is a 54 y/o female presenting w/ B lateral hip pain R>L x years that is progressively worsening.  She has a history of L5-S1 discectomy in 2019.  She rates her pain at a 6/10 and a 9/10 at it's worst and describes it as aching .  She reports swelling in the area just distal to her greater trochanters B, R>L.  Radiating pain: Lateral hip to proximal lateral thigh Low back pain: Yes Numbness/tingling: No Aggravating factors: Walking, laying on R side Treatments tried: Cyclobenzaprine and Gabapentin w/ no change; Mobic as of 08/14/19; Goody powder Diagnostic tests: B hip XR - 11/09/17  Past medical history, Surgical history, Family history, Social history, Allergies, and medications have been entered into the medical record, reviewed.   Review of Systems: No new headache, visual changes, nausea, vomiting, diarrhea, constipation, dizziness, abdominal pain, skin rash, fevers, chills, night sweats, weight loss, swollen lymph nodes, body aches, joint swelling, muscle aches, chest pain, shortness of breath, mood changes, visual or auditory hallucinations.   Objective:    Vitals:   08/21/19 1457  BP: 132/82  Pulse: 64  SpO2: 97%   General: Well Developed, well nourished, and in no acute distress.  Neuro/Psych: Alert and oriented x3, extra-ocular muscles intact, able to move all 4 extremities, sensation grossly intact. Skin: Warm and dry, no rashes noted.  Respiratory: Not using accessory muscles, speaking in full sentences, trachea midline.  Cardiovascular: Pulses palpable, no extremity edema. Abdomen: Does not appear distended. MSK:  L-spine: Nontender to spinal midline.  Normal lumbar motion. Right hip: Normal-appearing Normal motion. Tender palpation greater  trochanter. Hip abduction strength 4/5 with pain. External rotation strength 4/5 with pain. Adduction and internal rotation strength 5/5 without pain.  Left hip: Normal. Normal motion. Tender palpation greater trochanter. Hip abduction strength 4/5 with pain. External rotation strength 4+/5 without significant pain. Adduction and internal rotation 5/5 without pain.  Leg lengths equal. Mild antalgic gait.  Lab and Radiology Results  X-ray images hip and pelvis obtained February 2019 showed no severe degenerative changes.  No fractures.  X-rays personally independently reviewed today.  Impression and Recommendations:    Assessment and Plan: 54 y.o. female with bilateral hip pain.  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  Should be well treatable with home exercise program and physical therapy.  Recheck back in 4 to 6 weeks.  If no better will proceed with injection at that time.  Would consider repeat imaging including MRI in future if not improved..   Orders Placed This Encounter  Procedures  . Ambulatory referral to Physical Therapy    Referral Priority:   Routine    Referral Type:   Physical Medicine    Referral Reason:   Specialty Services Required    Requested Specialty:   Physical Therapy    Number of Visits Requested:   1   No orders of the defined types were placed in this encounter.   Discussed warning signs or symptoms. Please see discharge instructions. Patient expresses understanding.   The above documentation has been reviewed and is accurate and complete Lynne Leader

## 2019-08-21 NOTE — Patient Instructions (Addendum)
Thank you for coming in today.  Attend PT.  Work on the exercises we discussed.  Recheck in 4-6 weeks.  Return sooner if needed.   Please perform the exercise program that we have prepared for you and gone over in detail on a daily basis.  In addition to the handout you were provided you can access your program through: [email or web address]   Your unique program code is:  [id number]

## 2019-09-25 ENCOUNTER — Other Ambulatory Visit: Payer: Self-pay

## 2019-09-25 ENCOUNTER — Ambulatory Visit (INDEPENDENT_AMBULATORY_CARE_PROVIDER_SITE_OTHER): Payer: BC Managed Care – PPO | Admitting: Family Medicine

## 2019-09-25 VITALS — BP 132/88 | HR 78 | Ht 64.0 in | Wt 201.6 lb

## 2019-09-25 DIAGNOSIS — M7071 Other bursitis of hip, right hip: Secondary | ICD-10-CM

## 2019-09-25 DIAGNOSIS — M25559 Pain in unspecified hip: Secondary | ICD-10-CM

## 2019-09-25 DIAGNOSIS — M7072 Other bursitis of hip, left hip: Secondary | ICD-10-CM

## 2019-09-25 NOTE — Progress Notes (Signed)
   I, Wendy Poet, LAT, ATC, am serving as scribe for Dr. Lynne Leader.  Sarah Krause is a 54 y.o. female who presents to [place] at [facility] today for f/u of B hip pain, R>L.  She was last seen by Dr.Emmalise Huard on 08/21/19 and was referred to outpatient PT and provided a HEP consisting of hip aBd, bridges and ITB stretching.  Since her last visit, pt reports no change in her symptoms.  She states that both of her knees are bothering her more than her hips currently. R knee is worse than the L.  She states that she has not heard from [facility].     Pertinent review of systems: No fevers or chills.  Relevant historical information: History hip bursitis.   Exam:  BP 132/88 (BP Location: Left Arm, Patient Position: Sitting, Cuff Size: Large)   Pulse 78   Ht 5\' 4"  (1.626 m)   Wt 201 lb 9.6 oz (91.4 kg)   SpO2 98%   BMI 34.60 kg/m  General: Well Developed, well nourished, and in no acute distress.   MSK: Hips: Normal motion bilaterally.  Mild antalgic gait.    Lab and Radiology Results Reviewed imaging L-spine from 2019.    Assessment and Plan: 54 y.o. female with bilateral lateral hip pain due to hip bursitis/hip abductor tendinopathy.  Patient had trial of home exercise program but never did physical therapy.  Discussed options including trial of injection today versus retry physical therapy.  Referral placed again for physical therapy.  If not able to get it done at [facility] will arrange for location here in [place].    Discussed warning signs or symptoms. Please see discharge instructions. Patient expresses understanding.   The above documentation has been reviewed and is accurate and complete Lynne Leader     Total encounter time 20 minutes including charting time date of service.

## 2019-09-25 NOTE — Patient Instructions (Addendum)
(  [phone]: [facility].  Referral has been re-faxed.  Please call if you have not heard from them in a few days.  If we have problems we can do PT here in town.  Recheck again in about a month.  Next steps are injection and them MRI.

## 2019-10-02 DIAGNOSIS — M25569 Pain in unspecified knee: Secondary | ICD-10-CM | POA: Diagnosis not present

## 2019-10-02 DIAGNOSIS — R262 Difficulty in walking, not elsewhere classified: Secondary | ICD-10-CM | POA: Diagnosis not present

## 2019-10-02 DIAGNOSIS — M25651 Stiffness of right hip, not elsewhere classified: Secondary | ICD-10-CM | POA: Diagnosis not present

## 2019-10-02 DIAGNOSIS — M25551 Pain in right hip: Secondary | ICD-10-CM | POA: Diagnosis not present

## 2019-10-09 DIAGNOSIS — M25551 Pain in right hip: Secondary | ICD-10-CM | POA: Diagnosis not present

## 2019-10-09 DIAGNOSIS — M25569 Pain in unspecified knee: Secondary | ICD-10-CM | POA: Diagnosis not present

## 2019-10-09 DIAGNOSIS — M25651 Stiffness of right hip, not elsewhere classified: Secondary | ICD-10-CM | POA: Diagnosis not present

## 2019-10-09 DIAGNOSIS — R262 Difficulty in walking, not elsewhere classified: Secondary | ICD-10-CM | POA: Diagnosis not present

## 2019-10-13 DIAGNOSIS — M25651 Stiffness of right hip, not elsewhere classified: Secondary | ICD-10-CM | POA: Diagnosis not present

## 2019-10-13 DIAGNOSIS — R262 Difficulty in walking, not elsewhere classified: Secondary | ICD-10-CM | POA: Diagnosis not present

## 2019-10-13 DIAGNOSIS — M25569 Pain in unspecified knee: Secondary | ICD-10-CM | POA: Diagnosis not present

## 2019-10-13 DIAGNOSIS — M25551 Pain in right hip: Secondary | ICD-10-CM | POA: Diagnosis not present

## 2019-10-16 DIAGNOSIS — M25651 Stiffness of right hip, not elsewhere classified: Secondary | ICD-10-CM | POA: Diagnosis not present

## 2019-10-16 DIAGNOSIS — M25551 Pain in right hip: Secondary | ICD-10-CM | POA: Diagnosis not present

## 2019-10-16 DIAGNOSIS — M25569 Pain in unspecified knee: Secondary | ICD-10-CM | POA: Diagnosis not present

## 2019-10-16 DIAGNOSIS — R262 Difficulty in walking, not elsewhere classified: Secondary | ICD-10-CM | POA: Diagnosis not present

## 2019-10-23 ENCOUNTER — Ambulatory Visit (INDEPENDENT_AMBULATORY_CARE_PROVIDER_SITE_OTHER): Payer: BC Managed Care – PPO

## 2019-10-23 ENCOUNTER — Ambulatory Visit: Payer: Self-pay

## 2019-10-23 ENCOUNTER — Encounter: Payer: Self-pay | Admitting: Family Medicine

## 2019-10-23 ENCOUNTER — Ambulatory Visit (INDEPENDENT_AMBULATORY_CARE_PROVIDER_SITE_OTHER): Payer: BC Managed Care – PPO | Admitting: Family Medicine

## 2019-10-23 ENCOUNTER — Other Ambulatory Visit: Payer: Self-pay

## 2019-10-23 VITALS — BP 130/82 | HR 83 | Ht 64.0 in | Wt 201.0 lb

## 2019-10-23 DIAGNOSIS — M25569 Pain in unspecified knee: Secondary | ICD-10-CM | POA: Diagnosis not present

## 2019-10-23 DIAGNOSIS — M7071 Other bursitis of hip, right hip: Secondary | ICD-10-CM

## 2019-10-23 DIAGNOSIS — M7072 Other bursitis of hip, left hip: Secondary | ICD-10-CM

## 2019-10-23 DIAGNOSIS — M25651 Stiffness of right hip, not elsewhere classified: Secondary | ICD-10-CM | POA: Diagnosis not present

## 2019-10-23 DIAGNOSIS — M160 Bilateral primary osteoarthritis of hip: Secondary | ICD-10-CM | POA: Diagnosis not present

## 2019-10-23 DIAGNOSIS — R262 Difficulty in walking, not elsewhere classified: Secondary | ICD-10-CM | POA: Diagnosis not present

## 2019-10-23 DIAGNOSIS — M25551 Pain in right hip: Secondary | ICD-10-CM | POA: Diagnosis not present

## 2019-10-23 NOTE — Progress Notes (Addendum)
Rito Ehrlich, am serving as a Education administrator for Dr. Lynne Leader.  Atiyana Herrero is a 54 y.o. female who presents to [place] at [facility] today for f/u of B hip and B knee pain, R>L.  She was last seen by Dr. Georgina Snell on 09/25/19 and noted no change in her hip pain and new B knee pain.  She was referred to outpatient PT at [facility].  Since her last visit, pt reports hurting, but she did do some hiking for her birthday. Has had several PT visits unsure if it is helping or not.   Majority issue is bilateral lateral hip pain.  She has a little bit of knee pain which is less significant.   Pertinent review of systems: Fevers or chills  Relevant historical information: History of female stress incontinence   Exam:  BP 130/82 (BP Location: Left Arm, Patient Position: Sitting, Cuff Size: Large)   Pulse 83   Ht 5\' 4"  (1.626 m)   Wt 201 lb (91.2 kg)   SpO2 98%   BMI 34.50 kg/m  General: Well Developed, well nourished, and in no acute distress.   MSK: Hips bilaterally normal-appearing Tender palpation right lateral hip greater trochanter. Normal hip motion.   Lab and Radiology Results Diagnostic Limited MSK Ultrasound of: Right lateral hip Large amount of subcutaneous tissue over at lateral hip overlying greater trochanter.  Trace deep hypoechoic fluid overlying trochanter possible bursitis however resolution of ultrasound at this level was not ideal.  No obvious bony or tendon defect. Impression: Trochanteric bursitis/hip abductor tendinopathy.  X-ray images AP pelvis obtained today personally independently reviewed No acute fractures.  No severe degenerative changes. Await formal radiology review   Assessment and Plan: 54 y.o. female with bilateral hip pain laterally.  No large bursitis.  Patient may have a small amount of bursitis or tendinitis.  We discussed options.  At this point could proceed with trial of effectively nonguided injection, continue  physical therapy or proceed with MRI.  She likes to continue a bit more physical therapy.  If not improving would recommend MRI or injection.  Patient will let me know what she wants to choose. Recheck back with me as needed.Marland Kitchen   PDMP not reviewed this encounter. Orders Placed This Encounter  Procedures  . [place] LIMITED JOINT SPACE STRUCTURES LOW BILAT(NO LINKED CHARGES)    Order Specific Question:   Reason for Exam (SYMPTOM  OR DIAGNOSIS REQUIRED)    Answer:   eval hip pain BL Lat hip    Order Specific Question:   Preferred imaging location?    Answer:   [facility]  . DG Pelvis 1-2 Views    Standing Status:   Future    Number of Occurrences:   1    Standing Expiration Date:   12/22/2020    Order Specific Question:   Reason for Exam (SYMPTOM  OR DIAGNOSIS REQUIRED)    Answer:   AP pelvis Lat hip pain BL    Order Specific Question:   Is patient pregnant?    Answer:   No    Order Specific Question:   Preferred imaging location?    Answer:   [facility]    Order Specific Question:   Radiology Contrast Protocol - do NOT remove file path    Answer:   [email or web address]   No orders of the defined types were placed in this encounter.    Discussed warning signs or symptoms. Please see  discharge instructions. Patient expresses understanding.   The above documentation has been reviewed and is accurate and complete Lynne Leader

## 2019-10-23 NOTE — Patient Instructions (Signed)
Thank you for coming in today. Plan to give PT and home exercises more time.  Get xray today.  Next step is either good enough or MRI or trial injection.  Keep me updated.

## 2019-10-24 NOTE — Progress Notes (Signed)
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

## 2019-10-26 DIAGNOSIS — R262 Difficulty in walking, not elsewhere classified: Secondary | ICD-10-CM | POA: Diagnosis not present

## 2019-10-26 DIAGNOSIS — M25551 Pain in right hip: Secondary | ICD-10-CM | POA: Diagnosis not present

## 2019-10-26 DIAGNOSIS — M25569 Pain in unspecified knee: Secondary | ICD-10-CM | POA: Diagnosis not present

## 2019-10-26 DIAGNOSIS — M25651 Stiffness of right hip, not elsewhere classified: Secondary | ICD-10-CM | POA: Diagnosis not present

## 2019-10-30 DIAGNOSIS — M25651 Stiffness of right hip, not elsewhere classified: Secondary | ICD-10-CM | POA: Diagnosis not present

## 2019-10-30 DIAGNOSIS — M25569 Pain in unspecified knee: Secondary | ICD-10-CM | POA: Diagnosis not present

## 2019-10-30 DIAGNOSIS — M25551 Pain in right hip: Secondary | ICD-10-CM | POA: Diagnosis not present

## 2019-10-30 DIAGNOSIS — R262 Difficulty in walking, not elsewhere classified: Secondary | ICD-10-CM | POA: Diagnosis not present

## 2019-11-06 DIAGNOSIS — M25551 Pain in right hip: Secondary | ICD-10-CM | POA: Diagnosis not present

## 2019-11-06 DIAGNOSIS — M25569 Pain in unspecified knee: Secondary | ICD-10-CM | POA: Diagnosis not present

## 2019-11-06 DIAGNOSIS — R262 Difficulty in walking, not elsewhere classified: Secondary | ICD-10-CM | POA: Diagnosis not present

## 2019-11-06 DIAGNOSIS — M25651 Stiffness of right hip, not elsewhere classified: Secondary | ICD-10-CM | POA: Diagnosis not present

## 2019-11-15 DIAGNOSIS — M25569 Pain in unspecified knee: Secondary | ICD-10-CM | POA: Diagnosis not present

## 2019-11-15 DIAGNOSIS — M25651 Stiffness of right hip, not elsewhere classified: Secondary | ICD-10-CM | POA: Diagnosis not present

## 2019-11-15 DIAGNOSIS — M25551 Pain in right hip: Secondary | ICD-10-CM | POA: Diagnosis not present

## 2019-11-15 DIAGNOSIS — R262 Difficulty in walking, not elsewhere classified: Secondary | ICD-10-CM | POA: Diagnosis not present

## 2019-11-20 DIAGNOSIS — M25569 Pain in unspecified knee: Secondary | ICD-10-CM | POA: Diagnosis not present

## 2019-11-20 DIAGNOSIS — R262 Difficulty in walking, not elsewhere classified: Secondary | ICD-10-CM | POA: Diagnosis not present

## 2019-11-20 DIAGNOSIS — M25651 Stiffness of right hip, not elsewhere classified: Secondary | ICD-10-CM | POA: Diagnosis not present

## 2019-11-20 DIAGNOSIS — M25551 Pain in right hip: Secondary | ICD-10-CM | POA: Diagnosis not present

## 2019-11-21 ENCOUNTER — Encounter: Payer: Self-pay | Admitting: Family Medicine

## 2019-11-21 NOTE — Progress Notes (Signed)
Received records from [facility].  Patient has done quite well with physical therapy over 10 visits with 75 to 80% improvement. Will send note to scan and send back form to physical therapy.

## 2019-11-27 DIAGNOSIS — M25569 Pain in unspecified knee: Secondary | ICD-10-CM | POA: Diagnosis not present

## 2019-11-27 DIAGNOSIS — M25651 Stiffness of right hip, not elsewhere classified: Secondary | ICD-10-CM | POA: Diagnosis not present

## 2019-11-27 DIAGNOSIS — R262 Difficulty in walking, not elsewhere classified: Secondary | ICD-10-CM | POA: Diagnosis not present

## 2019-11-27 DIAGNOSIS — M25551 Pain in right hip: Secondary | ICD-10-CM | POA: Diagnosis not present

## 2020-01-31 IMAGING — CR DG LUMBAR SPINE 2-3V
2 series · 2 of 2 positions shown · non-contrast
Comparison: CT abdomen/pelvis dated 09/07/2014

CLINICAL DATA: Localization films for L5-S1 microdiskectomy

EXAM:
LUMBAR SPINE - 2-3 VIEW

[xtable lateral]
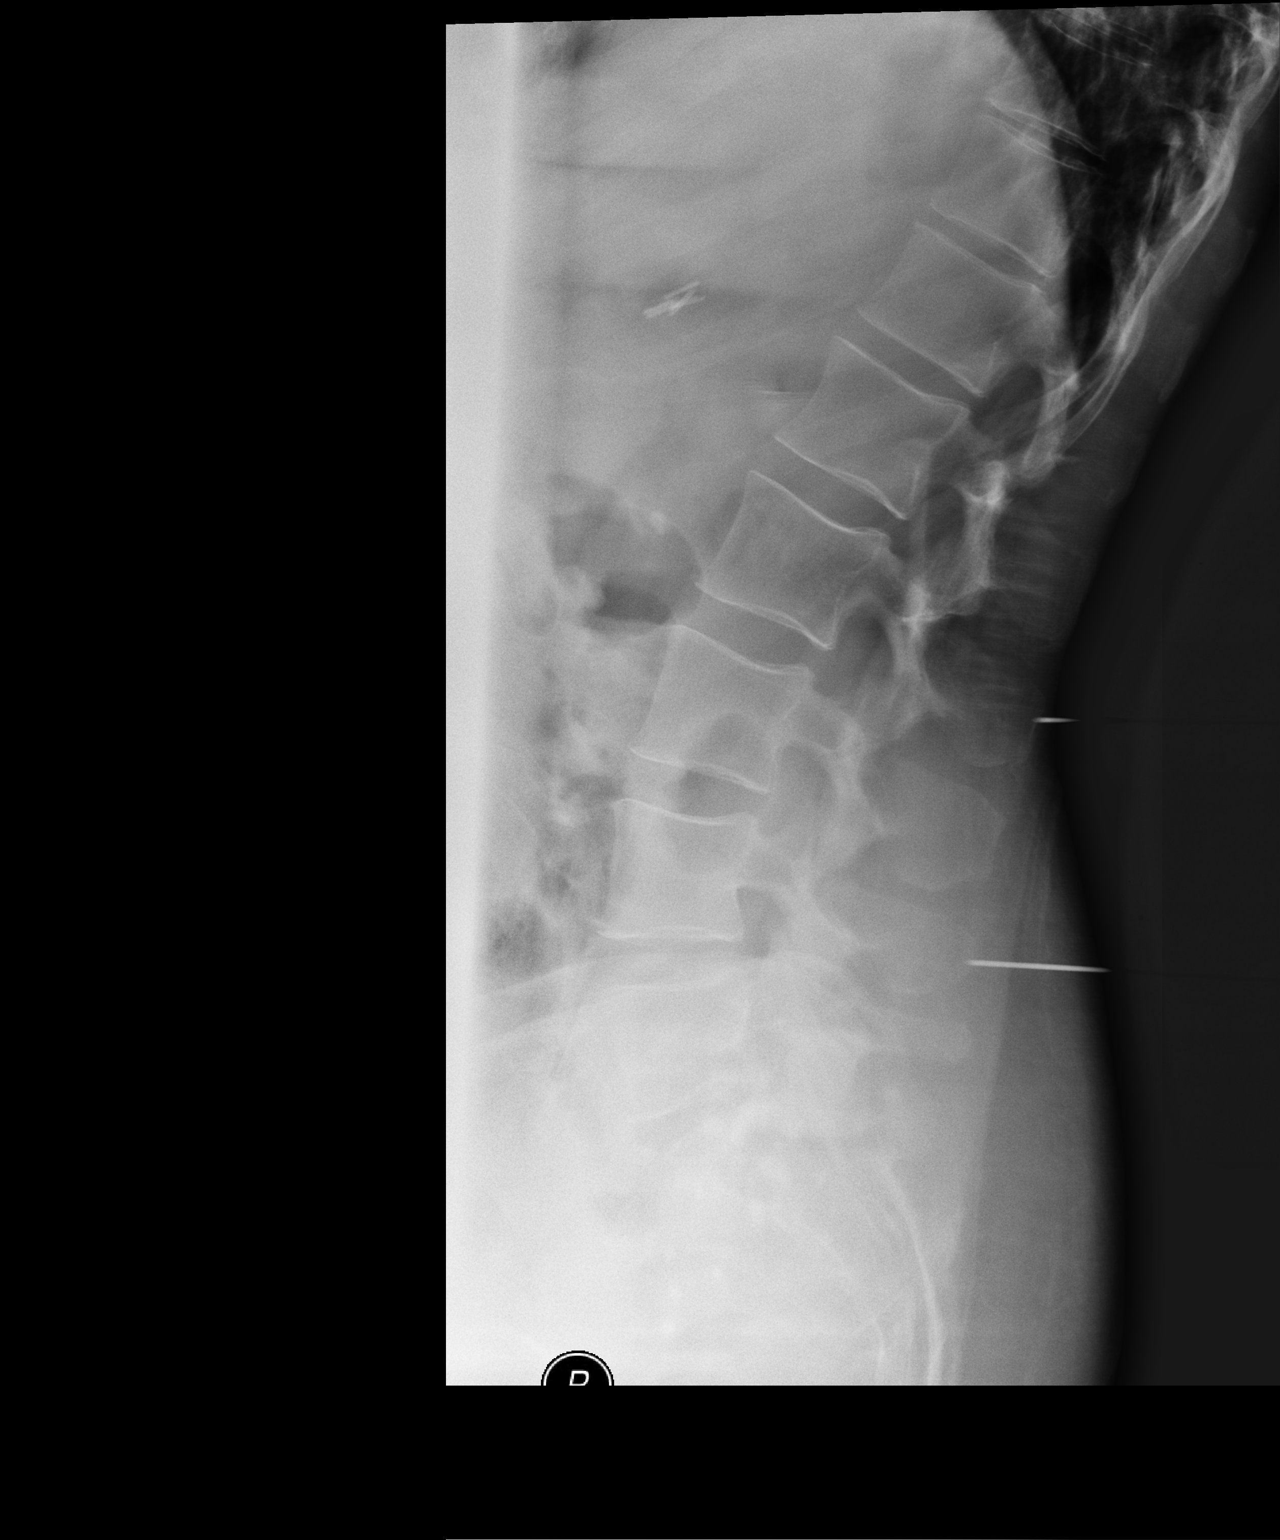

[AP]
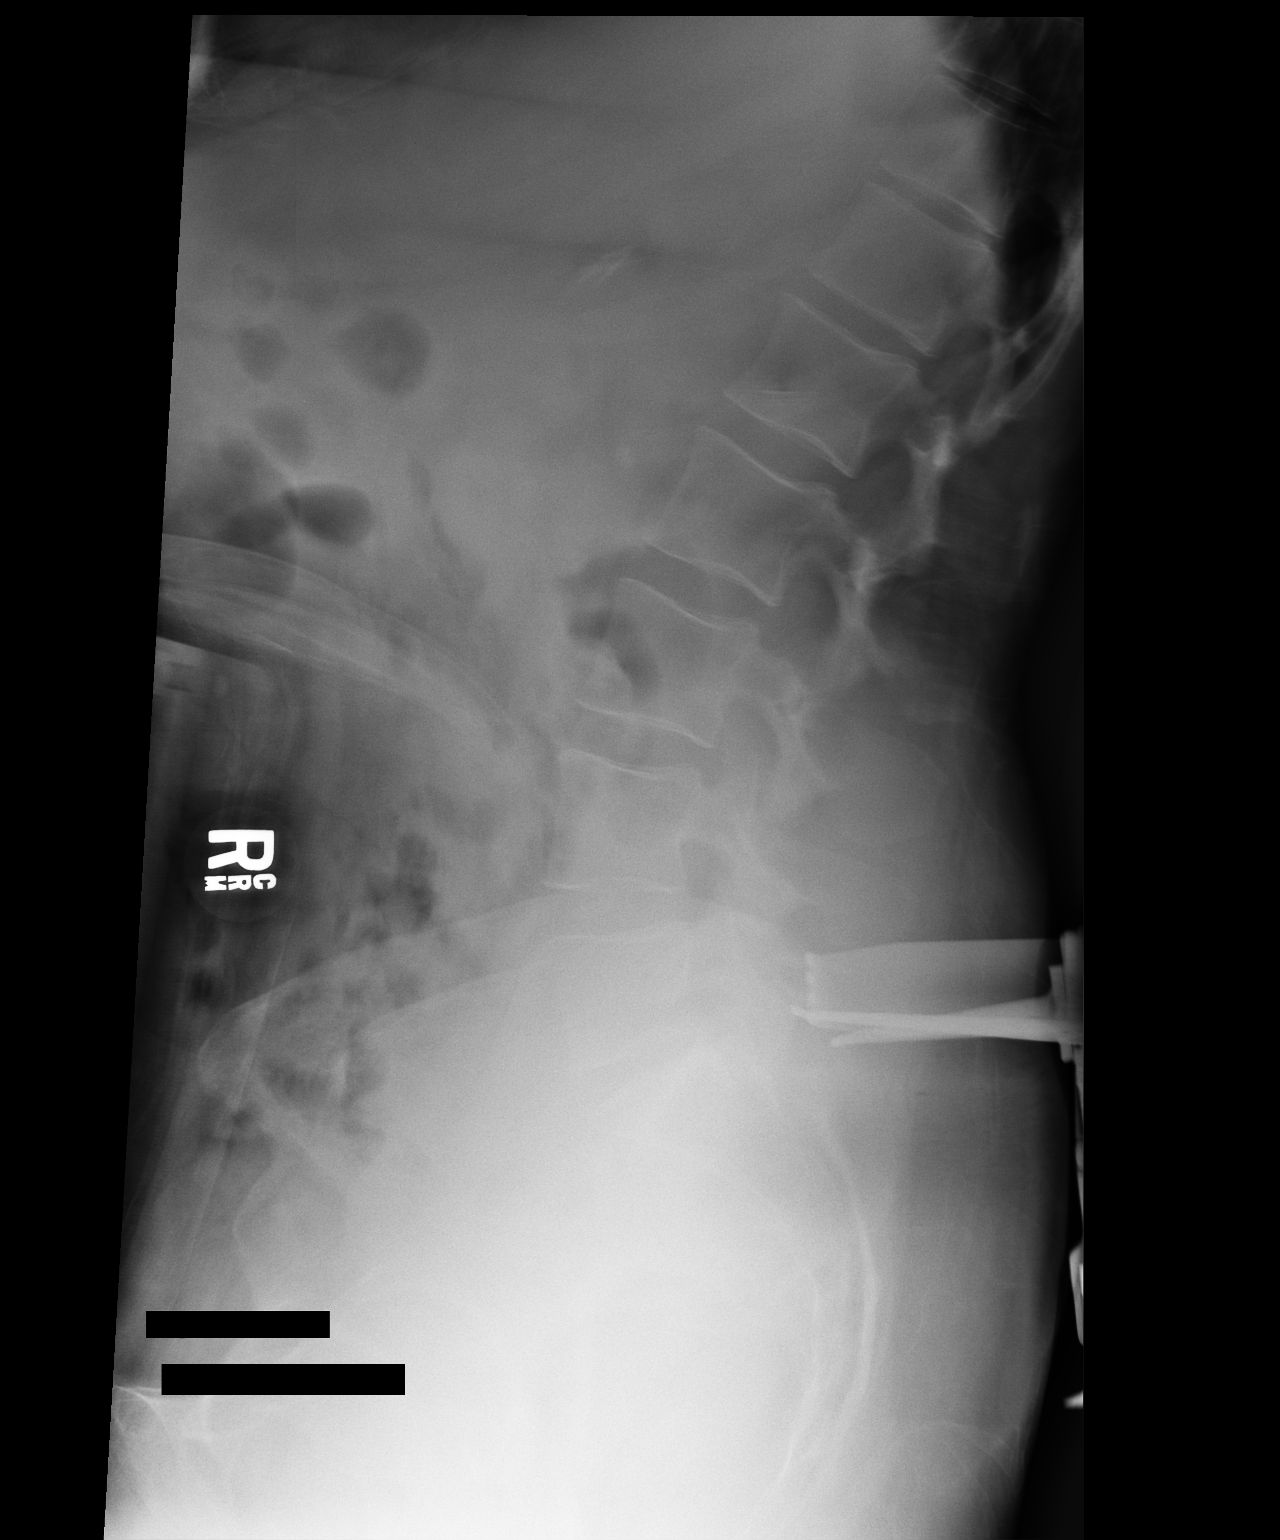

[2 of 2 positions shown; findings below may reference images not displayed]

FINDINGS: When correlating with prior CT, there is a vestigial right rib at
the T12 vertebral body, with 5 lumbar type vertebral bodies. Using
this nomenclature:

Initial radiograph demonstrates surgical probes posterior to the L2
and L4 spinous processes.

Second radiograph demonstrates surgical probes posterior to the L5
vertebral body.
IMPRESSION: Lumbar localization as above.

## 2020-02-10 ENCOUNTER — Other Ambulatory Visit: Payer: Self-pay | Admitting: Family Medicine

## 2020-02-12 ENCOUNTER — Telehealth: Payer: Self-pay | Admitting: Family Medicine

## 2020-02-12 MED ORDER — MELOXICAM 15 MG PO TABS: 15.0000 mg | ORAL_TABLET | Freq: Every day | ORAL | 0 refills | Status: DC

## 2020-02-12 NOTE — Telephone Encounter (Signed)
30 day supply sent to pharmacy to last pt until upcoming appt. Pt notified of refill being sent the pharmacy

## 2020-02-12 NOTE — Telephone Encounter (Signed)
Patient has scheduled an appt with Dr. Raoul Pitch on 02/28/20.  She has only 1 pill left.  meloxicam (MOBIC) 15 MG tablet [[id number]]   [facility]

## 2020-02-12 NOTE — Telephone Encounter (Signed)
States she called today to make an appt and ask for a refill of meloxicam. [facility] and they dont have a refill available for her.

## 2020-02-28 ENCOUNTER — Other Ambulatory Visit: Payer: Self-pay

## 2020-02-28 ENCOUNTER — Ambulatory Visit (INDEPENDENT_AMBULATORY_CARE_PROVIDER_SITE_OTHER): Payer: BC Managed Care – PPO | Admitting: Family Medicine

## 2020-02-28 ENCOUNTER — Encounter: Payer: Self-pay | Admitting: Family Medicine

## 2020-02-28 VITALS — BP 152/98 | HR 67 | Temp 97.9°F | Resp 17 | Ht 64.0 in | Wt 196.0 lb

## 2020-02-28 DIAGNOSIS — M5136 Other intervertebral disc degeneration, lumbar region: Secondary | ICD-10-CM

## 2020-02-28 DIAGNOSIS — M1711 Unilateral primary osteoarthritis, right knee: Secondary | ICD-10-CM

## 2020-02-28 DIAGNOSIS — M7071 Other bursitis of hip, right hip: Secondary | ICD-10-CM

## 2020-02-28 DIAGNOSIS — L989 Disorder of the skin and subcutaneous tissue, unspecified: Secondary | ICD-10-CM | POA: Diagnosis not present

## 2020-02-28 DIAGNOSIS — K649 Unspecified hemorrhoids: Secondary | ICD-10-CM

## 2020-02-28 DIAGNOSIS — M7072 Other bursitis of hip, left hip: Secondary | ICD-10-CM

## 2020-02-28 DIAGNOSIS — M47816 Spondylosis without myelopathy or radiculopathy, lumbar region: Secondary | ICD-10-CM

## 2020-02-28 DIAGNOSIS — M1611 Unilateral primary osteoarthritis, right hip: Secondary | ICD-10-CM

## 2020-02-28 MED ORDER — MELOXICAM 15 MG PO TABS: 15.0000 mg | ORAL_TABLET | Freq: Every day | ORAL | 1 refills | Status: DC

## 2020-02-28 NOTE — Patient Instructions (Addendum)
Follow up in 5.5 months - would schedule as a physical so we can get your preventive labs.   I have referred you to dermatology.

## 2020-02-28 NOTE — Progress Notes (Signed)
Patient ID: Sarah Krause, female  DOB: January 09, 1966, 54 y.o.   MRN: [id number] Patient Care Team    Relationship Specialty Notifications Start End  Ma Hillock, DO PCP - General Family Medicine  08/14/19   Kerney Elbe, MD  Obstetrics and Gynecology  08/17/19   Phylliss Bob, MD Consulting Physician Orthopedic Surgery  08/17/19   Manon Hilding, MD Referring Physician Vascular Surgery  08/17/19   Evelina Bucy, DPM Consulting Physician Podiatry  08/17/19     Chief Complaint  Patient presents with  . Hip Pain    Pt needs refill on meds    Subjective:  Sarah Krause is a 54 y.o.  female present for  Bilateral hip pain:  Patient reports her hip pain is about the same but improved when taking Mobic.  She did attend physical therapy for a few sessions and is now performing physical therapy at home.  She would like refills on the Mobic today.  Prior note: Patient presents today for new patient establishment with complaints of bilateral hip pain.  She states the right side of her lateral hip is worse than the left side, but there is discomfort bilaterally.  Patient has a history per prior x-rays reviewed 11/09/2017 of mild bilateral hip degenerative changes at that time.  Patient has underwent L5-S1 microdiscectomy in the past for lumbar degenerative disc disease.  She does not feel her pain is consistent with the pain she experienced from her lower lumbar degenerative disease.  She also endorses bilateral knee discomfort.  She states she knows she has arthritis in her knees and is wondering if wearing a daily compression dressing on her knees would be helpful. She has been tried on cyclobenzaprine and gabapentin for her lower back symptoms in 2019 and reports it was not effective.  She had also been prescribed diclofenac at that time she does not recall why this was stopped.  She currently is not taking any daily medications for her pain and arthritis, she does use Goody  powders frequently-Which she reports can be helpful. 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  She is on her feet and walking the majority of her day.  Patient also complains of a left leg bump that has been present for some time per patient.  It is not painful it is located in the left anterior mid thigh and is superficial.  She is wondering if there is something she should get taking care of.  Patient also declines colon cancer screening however she states she does have some hemorrhoids and would like to be evaluated to discuss options on treatment of her hemorrhoids.  Depression screen [facility] 2/9 08/14/2019  Decreased Interest 0  Down, Depressed, Hopeless 0  PHQ - 2 Score 0   No flowsheet data found.      Fall Risk  08/14/2019  Falls in the past year? 0   Immunization History  Administered Date(s) Administered  . Influenza,inj,Quad PF,6+ Mos 04/04/2019  . Influenza-Unspecified 06/03/2014  . Janssen (J&J) SARS-COV-2 Vaccination 11/11/2019  . MMR 01/01/1969    No exam data present  Past Medical History:  Diagnosis Date  . Arthritis   . Diverticulitis   . Dry eyes   . Family history of adverse reaction to anesthesia    mother hard time coming out of anesthia-("she's tiny"  . Hemorrhoids   . Herniation of intervertebral disc between L5 and S1  12/02/2017   Right L5-S1 large disc extrusion on the right with compression of the right side of the thecal sac and the right S1 nerve root.  Marland Kitchen History of kidney stones    passed  . Migraines   . Precancerous skin lesion    Abdomen and arm.   Allergies  Allergen Reactions  . Almond Oil Anaphylaxis and Hives  . Other Swelling     ALMONDS  . Sulfa Antibiotics Hives   Past Surgical History:  Procedure Laterality Date  . ABDOMINAL HYSTERECTOMY  2006   Partial hysterectomy with bladder tact  . Bladder mesh  2016   Placement of "bladder mesh "  . CHOLECYSTECTOMY  2004  .  COLONOSCOPY     has had 2  as 6//5/19  . DG right hip xray:   11/09/2017   No acute fracture or malalignment.  Mild bilateral hip degenerative changes.  Mild degenerative disc and facet disease of the lower lumbar spine.  . image CT abd/pelvis  09/07/2014   Rectosigmoid colonic wall thickening with adjacent fat stranding compatible with diverticulitis.  Mild common duct dilatation in the setting of prior cholecystectomy, felt to be incidental and likely at the upper end of normal range considering no right upper quadrant pain at the time.  . Image MRI:  12/02/2017   L 2-3 mild facet joint arthritis.  L3-4 mild facet joint arthritis.  L4-5 mild facet joint arthritis.  L5-S1 large disc extrusion on the right with compression of the right side of the thecal sac and the right S1 nerve.  . INCONTINENCE SURGERY    . LUMBAR LAMINECTOMY/DECOMPRESSION MICRODISCECTOMY Right 01/05/2018   Procedure: RIGHT SIDED LUMBAR FIVE - SACRUM ONE MICRODISECTOMY;  Surgeon: Phylliss Bob, MD;  Location: [facility];  Service: Orthopedics;  Laterality: Right;  . TONSILLECTOMY  1971  . Varicose veins Right 2016   March & June 2016.  . xray knee Right 04/19/2011   Mild patellofemoral and medial compartment osteoarthritis.  Tiny cortical buckle in the posterior lateral tibial plateau-normal variant.   Family History  Problem Relation Age of Onset  . Arthritis Mother   . Colitis Mother   . Macular degeneration Mother   . Heart disease Father   . Heart attack Father        died 43  . Diabetes Brother   . Arthritis Brother   . Hyperlipidemia Brother   . Macular degeneration Brother   . Ulcerative colitis Sister   . Parkinson's disease Maternal Grandfather   . Parkinson's disease Brother    Social History   Social History Narrative   Marital status/children/pets: Married.  2 children.   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   Safety:      -Wears a bicycle helmet riding a bike: Yes      -smoke alarm in the home:Yes     - wears seatbelt: Yes     - Feels safe in their relationships: Yes    Allergies as of 02/28/2020      Reactions   Almond Oil Anaphylaxis, Hives   Other Swelling   ALMONDS   Sulfa Antibiotics Hives      Medication List       Accurate as of February 28, 2020  3:41 PM. If you have any questions, ask your nurse or doctor.        CALCIUM 500 +D PO Calcium 500   meloxicam 15 MG tablet Commonly known as: MOBIC Take 1 tablet (15 mg  total) by mouth daily.       All past medical history, surgical history, allergies, family history, immunizations andmedications were updated in the EMR today and reviewed under the history and medication portions of their EMR.    No results found for this or any previous visit (from the past 2160 hour(s)).  No results found.   ROS: 14 pt review of systems performed and negative (unless mentioned in an HPI)  Objective: BP (!) 152/98 (BP Location: Right Arm, Patient Position: Sitting, Cuff Size: Normal)   Pulse 67   Temp 97.9 F (36.6 C) (Temporal)   Resp 17   Ht 5' 4"  (1.626 m)   Wt 196 lb (88.9 kg)   SpO2 97%   BMI 33.64 kg/m  Gen: Afebrile. No acute distress.  HENT: AT. [place].  Eyes:Pupils Equal Round Reactive to light, Extraocular movements intact,  Conjunctiva without redness, discharge or icterus. CV: RRR  Chest: CTAB, no cough Skin: No rashes, purpura or petechiae.  Left thigh: 2 x 3 cm palpable hard mobile soft tissue mass anterior mid thigh and subcutaneous tissue.  3 mm blue angioma/nevi medial thigh. Neuro: Normal gait. PERLA. EOMi. Alert. Oriented x3  Psych: Normal affect, dress and demeanor. Normal speech. Normal thought content and judgment.    Assessment/plan: Sarah Krause is a 54 y.o. female present for  Skin lesion Patient has a concerning either blue nevi or angioma of her medial left thigh. - Ambulatory referral to Dermatology  Hemorrhoids, unspecified hemorrhoid type Chronic  hemorrhoids.  She would like referral placed to consider surgical options  Primary osteoarthritis of right knee/Bursitis of both hips, unspecified bursa/Primary osteoarthritis of right hip/Lumbar degenerative disc disease/Facet arthropathy, lumbar stable -Patient has a history of osteoarthritis of her right hip, lumbar spine and right knee per prior x-rays reviewed in EMR.   -Mobic has been helpful. -continue  Mobic 15 mg daily with food. -She has seen sports medicine and physical therapy.  She is unsure if she wants to undergo injections at this time through sports med. -Follow-up 5.5 months if needed   No follow-ups on file. Orders Placed This Encounter  Procedures  . Ambulatory referral to Dermatology   Meds ordered this encounter  Medications  . meloxicam (MOBIC) 15 MG tablet    Sig: Take 1 tablet (15 mg total) by mouth daily.    Dispense:  90 tablet    Refill:  1      Note is dictated utilizing voice recognition software. Although note has been proof read prior to signing, occasional typographical errors still can be missed. If any questions arise, please do not hesitate to call for verification.  Electronically signed by: Howard Pouch, DO [place]

## 2020-04-05 DIAGNOSIS — K649 Unspecified hemorrhoids: Secondary | ICD-10-CM | POA: Diagnosis not present

## 2020-04-16 DIAGNOSIS — D1724 Benign lipomatous neoplasm of skin and subcutaneous tissue of left leg: Secondary | ICD-10-CM | POA: Diagnosis not present

## 2020-04-16 DIAGNOSIS — D1801 Hemangioma of skin and subcutaneous tissue: Secondary | ICD-10-CM | POA: Diagnosis not present

## 2020-04-16 DIAGNOSIS — D485 Neoplasm of uncertain behavior of skin: Secondary | ICD-10-CM | POA: Diagnosis not present

## 2020-04-16 DIAGNOSIS — D235 Other benign neoplasm of skin of trunk: Secondary | ICD-10-CM | POA: Diagnosis not present

## 2020-05-04 DIAGNOSIS — S0502XA Injury of conjunctiva and corneal abrasion without foreign body, left eye, initial encounter: Secondary | ICD-10-CM | POA: Diagnosis not present

## 2020-05-05 DIAGNOSIS — S0502XA Injury of conjunctiva and corneal abrasion without foreign body, left eye, initial encounter: Secondary | ICD-10-CM | POA: Diagnosis not present

## 2020-05-06 DIAGNOSIS — D1724 Benign lipomatous neoplasm of skin and subcutaneous tissue of left leg: Secondary | ICD-10-CM | POA: Diagnosis not present

## 2020-07-18 ENCOUNTER — Other Ambulatory Visit: Payer: Self-pay

## 2020-07-19 ENCOUNTER — Ambulatory Visit (INDEPENDENT_AMBULATORY_CARE_PROVIDER_SITE_OTHER): Payer: BC Managed Care – PPO | Admitting: Family Medicine

## 2020-07-19 ENCOUNTER — Encounter: Payer: Self-pay | Admitting: Family Medicine

## 2020-07-19 VITALS — BP 141/87 | HR 72 | Temp 98.0°F | Resp 16 | Wt 202.4 lb

## 2020-07-19 DIAGNOSIS — M7071 Other bursitis of hip, right hip: Secondary | ICD-10-CM | POA: Diagnosis not present

## 2020-07-19 DIAGNOSIS — G44209 Tension-type headache, unspecified, not intractable: Secondary | ICD-10-CM | POA: Diagnosis not present

## 2020-07-19 DIAGNOSIS — M7072 Other bursitis of hip, left hip: Secondary | ICD-10-CM | POA: Diagnosis not present

## 2020-07-19 DIAGNOSIS — M1611 Unilateral primary osteoarthritis, right hip: Secondary | ICD-10-CM | POA: Diagnosis not present

## 2020-07-19 MED ORDER — MELOXICAM 15 MG PO TABS: 15.0000 mg | ORAL_TABLET | Freq: Every day | ORAL | 1 refills | Status: DC

## 2020-07-19 MED ORDER — METAXALONE 800 MG PO TABS: 800.0000 mg | ORAL_TABLET | Freq: Two times a day (BID) | ORAL | 5 refills | Status: DC | PRN

## 2020-07-19 NOTE — Patient Instructions (Signed)
Skelaxin every 12 hours as needed.  Heating pad and massage. Neck stretches can also be every helpful.    Tension Headache, Adult A tension headache is pain, pressure, or aching in your head. Tension headaches can last from 30 minutes to several days. Follow these instructions at home: Managing pain  Take over-the-counter and prescription medicines only as told by your doctor.  When you have a headache, lie down in a dark, quiet room.  If told, put ice on your head and neck: ? Put ice in a plastic bag. ? Place a towel between your skin and the bag. ? Leave the ice on for 20 minutes, 2-3 times a day.  If told, put heat on the back of your neck. Do this as often as your doctor tells you to. Use the kind of heat that your doctor recommends, such as a moist heat pack or a heating pad. ? Place a towel between your skin and the heat. ? Leave the heat on for 20-30 minutes. ? Remove the heat if your skin turns bright red. Eating and drinking  Eat meals on a regular schedule.  Watch how much alcohol you drink: ? If you are a woman and are not pregnant, do not drink more than 1 drink a day. ? If you are a man, do not drink more than 2 drinks a day.  Drink enough fluid to keep your pee (urine) pale yellow.  Do not use a lot of caffeine, or stop using caffeine. Lifestyle  Get enough sleep. Get 7-9 hours of sleep each night. Or get the amount of sleep that your doctor tells you to.  At bedtime, remove all electronic devices from your room. Examples of electronic devices are computers, phones, and tablets.  Find ways to lessen your stress. Some things that can lessen stress are: ? Exercise. ? Deep breathing. ? Yoga. ? Music. ? Positive thoughts.  Sit up straight. Do not tighten (tense) your muscles.  Do not use any products that have nicotine or tobacco in them, such as cigarettes and e-cigarettes. If you need help quitting, ask your doctor. General instructions   Keep all  follow-up visits as told by your doctor. This is important.  Avoid things that can bring on headaches. Keep a journal to find out if certain things bring on headaches. For example, write down: ? What you eat and drink. ? How much sleep you get. ? Any change to your diet or medicines. Contact a doctor if:  Your headache does not get better.  Your headache comes back.  You have a headache and sounds, light, or smells bother you.  You feel sick to your stomach (nauseous) or you throw up (vomit).  Your stomach hurts. Get help right away if:  You suddenly get a very bad headache along with any of these: ? A stiff neck. ? Feeling sick to your stomach. ? Throwing up. ? Feeling weak. ? Trouble seeing. ? Feeling short of breath. ? A rash. ? Feeling unusually sleepy. ? Trouble speaking. ? Pain in your eye or ear. ? Trouble walking or balancing. ? Feeling like you will pass out (faint). ? Passing out. Summary  A tension headache is pain, pressure, or aching in your head.  Tension headaches can last from 30 minutes to several days.  Lifestyle changes and medicines may help relieve pain. This information is not intended to replace advice given to you by your health care provider. Make sure you discuss any questions you  have with your health care provider. Document Revised: 05/17/2019 Document Reviewed: 10/30/2016 Elsevier Patient Education  [place].

## 2020-07-19 NOTE — Progress Notes (Signed)
This visit occurred during the SARS-CoV-2 public health emergency.  Safety protocols were in place, including screening questions prior to the visit, additional usage of staff PPE, and extensive cleaning of exam room while observing appropriate contact time as indicated for disinfecting solutions.    Sarah Krause , October 22, 1965, 54 y.o., female MRN: [id number] Patient Care Team    Relationship Specialty Notifications Start End  Ma Hillock, DO PCP - General Family Medicine  08/14/19   Kerney Elbe, MD  Obstetrics and Gynecology  08/17/19   Phylliss Bob, MD Consulting Physician Orthopedic Surgery  08/17/19   Manon Hilding, MD Referring Physician Vascular Surgery  08/17/19   Evelina Bucy, DPM Consulting Physician Podiatry  08/17/19     Chief Complaint  Patient presents with   lumps on head     Subjective: Pt presents for an OV with complaints of tender lumps on the back of her head present for over a year.  She states at first she did not think much of the lumps.  However she has noticed sometimes they are tender or feel larger than at other times.  She is under a lot of stress admitting only.  She has 2 very stressful jobs.  She denies any bleeding or drainage from area.  Nothing she is aware of seems to make it better or worse.  Depression screen PHQ 2/9 08/14/2019  Decreased Interest 0  Down, Depressed, Hopeless 0  PHQ - 2 Score 0    Allergies  Allergen Reactions   Almond Oil Anaphylaxis and Hives   Other Swelling     ALMONDS   Sulfa Antibiotics Hives   Social History   Social History Narrative   Marital status/children/pets: Married.  2 children.   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   Safety:      -Wears a bicycle helmet riding a bike: Yes     -smoke alarm in the home:Yes     - wears seatbelt: Yes     - Feels safe in their relationships: Yes   Past Medical History:  Diagnosis Date   Arthritis     Diverticulitis    Dry eyes    Family history of adverse reaction to anesthesia    mother hard time coming out of anesthia-("she's tiny"   Hemorrhoids    Herniation of intervertebral disc between L5 and S1 12/02/2017   Right L5-S1 large disc extrusion on the right with compression of the right side of the thecal sac and the right S1 nerve root.   History of kidney stones    passed   Migraines    Precancerous skin lesion    Abdomen and arm.   Past Surgical History:  Procedure Laterality Date   ABDOMINAL HYSTERECTOMY  2006   Partial hysterectomy with bladder tact   Bladder mesh  2016   Placement of "bladder mesh "   CHOLECYSTECTOMY  2004   COLONOSCOPY     has had 2  as 6//5/19   DG right hip xray:   11/09/2017   No acute fracture or malalignment.  Mild bilateral hip degenerative changes.  Mild degenerative disc and facet disease of the lower lumbar spine.   image CT abd/pelvis  09/07/2014   Rectosigmoid colonic wall thickening with adjacent fat stranding compatible with diverticulitis.  Mild common duct dilatation in the setting of prior cholecystectomy, felt to be incidental and likely at the upper end of normal range considering no right upper  quadrant pain at the time.   Image MRI:  12/02/2017   L 2-3 mild facet joint arthritis.  L3-4 mild facet joint arthritis.  L4-5 mild facet joint arthritis.  L5-S1 large disc extrusion on the right with compression of the right side of the thecal sac and the right S1 nerve.   INCONTINENCE SURGERY     LUMBAR LAMINECTOMY/DECOMPRESSION MICRODISCECTOMY Right 01/05/2018   Procedure: RIGHT SIDED LUMBAR FIVE - SACRUM ONE MICRODISECTOMY;  Surgeon: Phylliss Bob, MD;  Location: [facility];  Service: Orthopedics;  Laterality: Right;   TONSILLECTOMY  1971   Varicose veins Right 2016   March & June 2016.   xray knee Right 04/19/2011   Mild patellofemoral and medial compartment osteoarthritis.  Tiny cortical buckle in the posterior lateral  tibial plateau-normal variant.   Family History  Problem Relation Age of Onset   Arthritis Mother    Colitis Mother    Macular degeneration Mother    Heart disease Father    Heart attack Father        died 66   Diabetes Brother    Arthritis Brother    Hyperlipidemia Brother    Macular degeneration Brother    Ulcerative colitis Sister    Parkinson's disease Maternal Grandfather    Parkinson's disease Brother    Allergies as of 07/19/2020      Reactions   Almond Oil Anaphylaxis, Hives   Other Swelling   ALMONDS   Sulfa Antibiotics Hives      Medication List       Accurate as of July 19, 2020  1:00 PM. If you have any questions, ask your nurse or doctor.        meloxicam 15 MG tablet Commonly known as: MOBIC Take 1 tablet (15 mg total) by mouth daily.   metaxalone 800 MG tablet Commonly known as: SKELAXIN Take 1 tablet (800 mg total) by mouth 2 (two) times daily as needed for muscle spasms. Started by: Howard Pouch, DO       All past medical history, surgical history, allergies, family history, immunizations andmedications were updated in the EMR today and reviewed under the history and medication portions of their EMR.     ROS: Negative, with the exception of above mentioned in HPI   Objective:  BP (!) 141/87 (BP Location: Right Arm, Patient Position: Sitting, Cuff Size: Normal)    Pulse 72    Temp 98 F (36.7 C) (Oral)    Resp 16    Wt 202 lb 6.4 oz (91.8 kg)    SpO2 97%    BMI 34.74 kg/m  Body mass index is 34.74 kg/m. Gen: Afebrile. No acute distress. Nontoxic in appearance, well developed, well nourished.  HENT: AT. [place].  Eyes:Pupils Equal Round Reactive to light, Extraocular movements intact,  Conjunctiva without redness, discharge or icterus. Neck/lymp/endocrine: Supple, no lymphadenopathy MSK: No erythema, no soft tissue swelling of head or neck.  2 areas of concern normal anatomy along the skull base.  Ropiness bilateral paracervical  muscles and trap muscles.  Normal range of motion cervical spine.  Neurovascularly intact distally. Skin: No rashes, purpura or petechiae.  Neuro:  Normal gait. PERLA. EOMi. Alert. Oriented x3  Psych: Normal affect, dress and demeanor. Normal speech. Normal thought content and judgment.  No exam data present No results found. No results found for this or any previous visit (from the past 24 hour(s)).  Assessment/Plan: Sarah Krause is a 54 y.o. female present for OV for  Tension headache  The 2 areas of concern are normal anatomy along occipital skull base.  Discussed normal anatomy and tension headaches. Skelaxin twice daily prescribed for her as needed. Patient was encouraged to use heating pad, stretches, massage to help with discomfort. Follow-up as needed, sooner if worsening  Arthritis/bursitis: Stable. Continue Mobic 15 mg daily   Reviewed expectations re: course of current medical issues.  Discussed self-management of symptoms.  Outlined signs and symptoms indicating need for more acute intervention.  Patient verbalized understanding and all questions were answered.  Patient received an After-Visit Summary.    No orders of the defined types were placed in this encounter.  Meds ordered this encounter  Medications   metaxalone (SKELAXIN) 800 MG tablet    Sig: Take 1 tablet (800 mg total) by mouth 2 (two) times daily as needed for muscle spasms.    Dispense:  60 tablet    Refill:  5   meloxicam (MOBIC) 15 MG tablet    Sig: Take 1 tablet (15 mg total) by mouth daily.    Dispense:  90 tablet    Refill:  1   Referral Orders  No referral(s) requested today     Note is dictated utilizing voice recognition software. Although note has been proof read prior to signing, occasional typographical errors still can be missed. If any questions arise, please do not hesitate to call for verification.   electronically signed by:  Howard Pouch, DO  [place]

## 2020-09-03 HISTORY — PX: OTHER SURGICAL HISTORY: SHX169

## 2020-09-16 ENCOUNTER — Other Ambulatory Visit: Payer: Self-pay

## 2020-09-17 ENCOUNTER — Ambulatory Visit (INDEPENDENT_AMBULATORY_CARE_PROVIDER_SITE_OTHER): Payer: BC Managed Care – PPO | Admitting: Family Medicine

## 2020-09-17 ENCOUNTER — Encounter: Payer: Self-pay | Admitting: Family Medicine

## 2020-09-17 VITALS — BP 134/90 | HR 87 | Temp 97.6°F | Ht 64.0 in

## 2020-09-17 DIAGNOSIS — M79604 Pain in right leg: Secondary | ICD-10-CM

## 2020-09-17 DIAGNOSIS — M7989 Other specified soft tissue disorders: Secondary | ICD-10-CM | POA: Diagnosis not present

## 2020-09-17 DIAGNOSIS — R252 Cramp and spasm: Secondary | ICD-10-CM

## 2020-09-17 NOTE — Patient Instructions (Addendum)
Nice to see you today.   Peripheral Vascular Disease  Peripheral vascular disease (PVD) is a disease of the blood vessels that carry blood from the heart to the rest of the body. PVD is also called peripheral artery disease (PAD) or poor circulation. PVD affects most of the body. But it affects the legs and feet the most. PVD can lead to acute limb ischemia. This happens when there is a sudden stop of blood flow to an arm or leg. This is a medical emergency. What are the causes? The most common cause of PVD is a buildup of a fatty substance (plaque) inside your arteries. This decreases blood flow. Plaque can break off and block blood in a smaller artery. This can lead to acute limb ischemia. Other common causes of PVD include:  Blood clots inside the blood vessels.  Injuries to blood vessels.  Irritation and swelling of blood vessels.  Sudden tightening of the blood vessel (spasms). What increases the risk?  A family history of PVD.  Medical conditions, including: ? High cholesterol. ? Diabetes. ? High blood pressure. ? Heart disease. ? Past problems with blood clots. ? Past injury, such as burns or a broken bone.  Other conditions, such as: ? Buerger's disease. This is caused by swollen or irritated blood vessels in your hands and feet. ? Arthritis. ? Birth defects that affect the arteries in your legs. ? Kidney disease.  Using tobacco or nicotine products.  Not getting enough exercise.  Being very overweight (obese).  Being 35 years old or older. What are the signs or symptoms?  Cramps in your butt, legs, and feet.  Pain and weakness in your legs when you are active that goes away when you rest.  Leg pain when at rest.  Leg numbness, tingling, or weakness.  Coldness in a leg or foot, especially when compared with the other leg or foot.  Skin or hair changes. These can include: ? Hair loss. ? Shiny skin. ? Pale or bluish skin. ? Thick toenails.  Being  unable to get or keep an erection.  Tiredness (fatigue).  Weak pulse or no pulse in the feet.  Wounds and sores on the toes, feet, or legs. These take longer to heal. How is this treated? Underlying causes are treated first. Other conditions, like diabetes, high cholesterol, and blood pressure, are also treated. Treatment may include:  Lifestyle changes, such as: ? Quitting smoking. ? Getting regular exercise. ? Having a diet low in fat and cholesterol. ? Not drinking alcohol.  Taking medicines, such as: ? Blood thinners. ? Medicines to improve blood flow. ? Medicines to improve your blood cholesterol.  Procedures to: ? Open the arteries and restore blood flow. ? Insert a small mesh tube (stent) to keep a blocked vessel open. ? Create a new path for blood to flow to the body (peripheral bypass). ? Remove dead tissue from a wound. ? Remove an affected leg or arm. Follow these instructions at home: Medicines  Take over-the-counter and prescription medicines only as told by your doctor.  If you are taking blood thinners: ? Talk with your doctor before you take any medicines that have aspirin, or NSAIDs, such as ibuprofen. ? Take medicines exactly as told. Take them at the same time each day. ? Avoid doing things that could hurt or bruise you. Take action to prevent falls. ? Wear an alert bracelet or carry a card that shows you are taking blood thinners. Lifestyle  Get regular exercise. Ask  your doctor about how to stay active.  Talk with your doctor about keeping a healthy weight. If needed, ask about losing weight.  Eat a diet that is low in fat and cholesterol. If you need help, talk with your doctor.  Do not drink alcohol.  Do not smoke or use any products that contain nicotine or tobacco. If you need help quitting, ask your doctor.      General instructions  Take good care of your feet. To do this: ? Wear shoes that fit well and feel good. ? Check your feet  often for any cuts or sores.  Get a flu shot (influenza vaccine) each year.  Keep all follow-up visits. Where to find more information  Society for Vascular Surgery: vascular.org  American Heart Association: heart.org  National Heart, Lung, and Blood Institute: https://www.[place].biz/ Contact a doctor if:  You have cramps in your legs when you walk.  You have leg pain when you rest.  Your leg or foot feels cold.  Your skin changes.  You cannot get or keep an erection.  You have cuts or sores on your legs or feet that do not heal. Get help right away if:  You have sudden changes in the color and feeling of your arms or legs, such as: ? Your arm or leg turns cold, numb, and blue. ? Your arm or leg becomes red, warm, swollen, painful, or numb.  You have any signs of a stroke. "BE FAST" is an easy way to remember the main warning signs: ? B - Balance. Dizziness, sudden trouble walking, or loss of balance. ? E - Eyes. Trouble seeing or a change in how you see. ? F - Face. Sudden weakness or loss of feeling of the face. The face or eyelid may droop on one side. ? A - Arms. Weakness or loss of feeling in an arm. This happens all of a sudden and most often on one side of the body. ? S - Speech. Sudden trouble speaking, slurred speech, or trouble understanding what people say. ? T - Time. Time to call emergency services. Write down what time symptoms started.  You have other signs of a stroke, such as: ? A sudden, very bad headache with no known cause. ? Feeling like you may vomit (nausea). ? Vomiting. ? A seizure.  You have chest pain or trouble breathing. These symptoms may be an emergency. Get help right away. Call your local emergency services (911 in the [place]  Do not wait to see if the symptoms will go away.  Do not drive yourself to the hospital. Summary  Peripheral vascular disease (PVD) is a disease of the blood vessels.  PVD affects the legs and feet the  most.  Symptoms may include leg pain or leg numbness, tingling, and weakness.  Treatment may include lifestyle changes, medicines, and procedures. This information is not intended to replace advice given to you by your health care provider. Make sure you discuss any questions you have with your health care provider. Document Revised: 01/22/2020 Document Reviewed: 01/22/2020 Elsevier Patient Education  [place].

## 2020-09-17 NOTE — Progress Notes (Signed)
This visit occurred during the SARS-CoV-2 public health emergency.  Safety protocols were in place, including screening questions prior to the visit, additional usage of staff PPE, and extensive cleaning of exam room while observing appropriate contact time as indicated for disinfecting solutions.    Sarah Krause , 06-Oct-1965, 55 y.o., female MRN: [id number] Patient Care Team    Relationship Specialty Notifications Start End  Ma Hillock, DO PCP - General Family Medicine  08/14/19   Kerney Elbe, MD  Obstetrics and Gynecology  08/17/19   Phylliss Bob, MD Consulting Physician Orthopedic Surgery  08/17/19   Manon Hilding, MD Referring Physician Vascular Surgery  08/17/19   Evelina Bucy, DPM Consulting Physician Podiatry  08/17/19     Chief Complaint  Patient presents with  . Leg Swelling    Pt c/o right leg/foot swelling with leg cramping on lower leg (resolved for today) x 2 weeks     Subjective: Pt presents for an OV with complaints of right leg and foot swelling with cramping of 2 weeks duration.  Pt reports the swelling is not present today. She reports being awakened around 4:00 in the morning with a rather significant right posterior calf spasm/cramp that extended down into her foot.  The cramp last about 15 minutes and was relieved by stretching and walking.  She reports since that time she feels her leg is intermittently swollen and her foot seems intermittently swollen.  She feels her shoe feels tight on the right compared to normal.  She feels her calf remains mildly sore.  She denies any fever, chills, cough or shortness of breath.  She denies any palpitations.  She denies any known trauma to the area prior to onset.  She denies any bruising.  She does admit she will frequently sit on that foot with bended knee.  She works 3 jobs and is on her feet the majority of the day.  She attempts to hydrate but admits she only drinks about 48 ounces of water  daily.  She has a h/o varicose veins with surgical correction in 2016 when her right leg.  She has a history of  arthritis of hips, back and knees. She is chronic mobic and skelaxin.  She states the pain does not feel like the pain she is experienced in the past in relation to her back.   Depression screen [facility] 2/9 09/17/2020 08/14/2019  Decreased Interest 0 0  Down, Depressed, Hopeless 0 0  PHQ - 2 Score 0 0    Allergies  Allergen Reactions  . Almond Oil Anaphylaxis and Hives  . Other Swelling     ALMONDS  . Sulfa Antibiotics Hives   Social History   Social History Narrative   Marital status/children/pets: Married.  2 children.   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   Safety:      -Wears a bicycle helmet riding a bike: Yes     -smoke alarm in the home:Yes     - wears seatbelt: Yes     - Feels safe in their relationships: Yes   Past Medical History:  Diagnosis Date  . Arthritis   . Diverticulitis   . Dry eyes   . Family history of adverse reaction to anesthesia    mother hard time coming out of anesthia-("she's tiny"  . Hemorrhoids   . Herniation of intervertebral disc between L5 and S1 12/02/2017   Right L5-S1 large disc extrusion on the right with  compression of the right side of the thecal sac and the right S1 nerve root.  Marland Kitchen History of kidney stones    passed  . Migraines   . Precancerous skin lesion    Abdomen and arm.   Past Surgical History:  Procedure Laterality Date  . ABDOMINAL HYSTERECTOMY  2006   Partial hysterectomy with bladder tact  . Bladder mesh  2016   Placement of "bladder mesh "  . CHOLECYSTECTOMY  2004  . COLONOSCOPY     has had 2  as 6//5/19  . DG right hip xray:   11/09/2017   No acute fracture or malalignment.  Mild bilateral hip degenerative changes.  Mild degenerative disc and facet disease of the lower lumbar spine.  . image CT abd/pelvis  09/07/2014   Rectosigmoid colonic wall thickening with adjacent  fat stranding compatible with diverticulitis.  Mild common duct dilatation in the setting of prior cholecystectomy, felt to be incidental and likely at the upper end of normal range considering no right upper quadrant pain at the time.  . Image MRI:  12/02/2017   L 2-3 mild facet joint arthritis.  L3-4 mild facet joint arthritis.  L4-5 mild facet joint arthritis.  L5-S1 large disc extrusion on the right with compression of the right side of the thecal sac and the right S1 nerve.  . INCONTINENCE SURGERY    . LUMBAR LAMINECTOMY/DECOMPRESSION MICRODISCECTOMY Right 01/05/2018   Procedure: RIGHT SIDED LUMBAR FIVE - SACRUM ONE MICRODISECTOMY;  Surgeon: Phylliss Bob, MD;  Location: [facility];  Service: Orthopedics;  Laterality: Right;  . TONSILLECTOMY  1971  . Varicose veins Right 2016   March & June 2016.  . xray knee Right 04/19/2011   Mild patellofemoral and medial compartment osteoarthritis.  Tiny cortical buckle in the posterior lateral tibial plateau-normal variant.   Family History  Problem Relation Age of Onset  . Arthritis Mother   . Colitis Mother   . Macular degeneration Mother   . Heart disease Father   . Heart attack Father        died 31  . Diabetes Brother   . Arthritis Brother   . Hyperlipidemia Brother   . Macular degeneration Brother   . Ulcerative colitis Sister   . Parkinson's disease Maternal Grandfather   . Parkinson's disease Brother    Allergies as of 09/17/2020      Reactions   Almond Oil Anaphylaxis, Hives   Other Swelling   ALMONDS   Sulfa Antibiotics Hives      Medication List       Accurate as of September 17, 2020  4:46 PM. If you have any questions, ask your nurse or doctor.        meloxicam 15 MG tablet Commonly known as: MOBIC Take 1 tablet (15 mg total) by mouth daily.   metaxalone 800 MG tablet Commonly known as: SKELAXIN Take 1 tablet (800 mg total) by mouth 2 (two) times daily as needed for muscle spasms.   neomycin-polymyxin  b-dexamethasone 3.5-10000-0.1 Susp Commonly known as: MAXITROL SMARTSIG:In Eye(s)       All past medical history, surgical history, allergies, family history, immunizations andmedications were updated in the EMR today and reviewed under the history and medication portions of their EMR.     ROS: Negative, with the exception of above mentioned in HPI   Objective:  BP 134/90   Pulse 87   Temp 97.6 F (36.4 C) (Oral)   Ht 5\' 4"  (1.626 m)   SpO2 96%  BMI 34.74 kg/m  Body mass index is 34.74 kg/m. Gen: Afebrile. No acute distress. Nontoxic in appearance, well developed, well nourished.  HENT: AT. [place]. No cough or hoarseness CV: RRR  Chest: CTAB, no wheeze or crackles. Good air movement, normal resp effort.  MSK: Right lower extremity without erythema, very mild soft tissue swelling inferior to medial knee.  Mild paleness to skin in this location.  Skin temperature cooler right medial aspect of lower extremity below the medial knee joint line.  Of motion of the lower extremity.  Muscle strength 5/5 bilateral lower extremity.  Negative Homans.  Sensation intact.  Equal, but diminished bilateral posterior tibialis pulse. Skin: no rashes, bruising, purpura or petechiae.  Neuro: Normal gait. PERLA. EOMi. Alert. Oriented x3    No exam data present No results found. No results found for this or any previous visit (from the past 24 hour(s)).  Assessment/Plan: Jahmiyah Dullea is a 55 y.o. female present for OV for  Leg cramps/pain and swelling right lower extremity A mild swelling located just below right medial joint line with palpable skin temp decrease in this area in comparison to the rest of the lower extremity and her left lower extremity.  Cannot rule out possible vascular cause for her abnormal exam findings described above and her right lower extremity discomfort/swelling.  Discussed work-up with arterial duplex and venous duplex and she is agreeable to this today. - Basic  Metabolic Panel (BMET) - CBC w/Diff - TSH - VAS [place] ABI WITH/WO TBI; Future - VAS [place] LOWER EXTREMITY ARTERIAL DUPLEX; Future - VAS [place] LOWER EXTREMITY VENOUS (DVT); Future Will be called with results.  If leg becomes more swollen, red, painful or shortness of breath occur she is urged to be seen emergently.    Reviewed expectations re: course of current medical issues.  Discussed self-management of symptoms.  Outlined signs and symptoms indicating need for more acute intervention.  Patient verbalized understanding and all questions were answered.  Patient received an After-Visit Summary.    Orders Placed This Encounter  Procedures  . Basic Metabolic Panel (BMET)  . CBC w/Diff  . TSH  . VAS [place] ABI WITH/WO TBI  . VAS [place] LOWER EXTREMITY ARTERIAL DUPLEX  . VAS [place] LOWER EXTREMITY VENOUS (DVT)   No orders of the defined types were placed in this encounter.  Referral Orders  No referral(s) requested today     Note is dictated utilizing voice recognition software. Although note has been proof read prior to signing, occasional typographical errors still can be missed. If any questions arise, please do not hesitate to call for verification.   electronically signed by:  Howard Pouch, DO  [place]

## 2020-09-18 LAB — BASIC METABOLIC PANEL
BUN: 17 mg/dL (ref 7–25)
CO2: 23 mmol/L (ref 20–32)
Calcium: 9.3 mg/dL (ref 8.6–10.4)
Chloride: 108 mmol/L (ref 98–110)
Creat: 0.65 mg/dL (ref 0.50–1.05)
Glucose, Bld: 96 mg/dL (ref 65–99)
Potassium: 4.2 mmol/L (ref 3.5–5.3)
Sodium: 142 mmol/L (ref 135–146)

## 2020-09-18 LAB — CBC WITH DIFFERENTIAL/PLATELET
Absolute Monocytes: 445 cells/uL (ref 200–950)
Basophils Absolute: 50 cells/uL (ref 0–200)
Basophils Relative: 1.2 %
Eosinophils Absolute: 147 cells/uL (ref 15–500)
Eosinophils Relative: 3.5 %
HCT: 42.9 % (ref 35.0–45.0)
Hemoglobin: 14.4 g/dL (ref 11.7–15.5)
Lymphs Abs: 1722 cells/uL (ref 850–3900)
MCH: 28.5 pg (ref 27.0–33.0)
MCHC: 33.6 g/dL (ref 32.0–36.0)
MCV: 84.8 fL (ref 80.0–100.0)
MPV: 11.3 fL (ref 7.5–12.5)
Monocytes Relative: 10.6 %
Neutro Abs: 1835 cells/uL (ref 1500–7800)
Neutrophils Relative %: 43.7 %
Platelets: 214 10*3/uL (ref 140–400)
RBC: 5.06 10*6/uL (ref 3.80–5.10)
RDW: 13.2 % (ref 11.0–15.0)
Total Lymphocyte: 41 %
WBC: 4.2 10*3/uL (ref 3.8–10.8)

## 2020-09-18 LAB — TSH: TSH: 1.2 mIU/L (ref 0.40–4.50)

## 2020-09-24 ENCOUNTER — Other Ambulatory Visit: Payer: Self-pay | Admitting: Family Medicine

## 2020-09-24 DIAGNOSIS — R252 Cramp and spasm: Secondary | ICD-10-CM

## 2020-10-08 ENCOUNTER — Other Ambulatory Visit: Payer: Self-pay

## 2020-10-08 ENCOUNTER — Ambulatory Visit (HOSPITAL_COMMUNITY)
Admission: RE | Admit: 2020-10-08 | Discharge: 2020-10-08 | Disposition: A | Discharge status: Home / Self Care | Payer: BC Managed Care – PPO | Source: Ambulatory Visit | Attending: Internal Medicine | Admitting: Internal Medicine

## 2020-10-08 ENCOUNTER — Inpatient Hospital Stay (HOSPITAL_COMMUNITY): Admission: RE | Admit: 2020-10-08 | Payer: BC Managed Care – PPO | Source: Ambulatory Visit

## 2020-10-08 DIAGNOSIS — R252 Cramp and spasm: Secondary | ICD-10-CM | POA: Insufficient documentation

## 2020-10-08 DIAGNOSIS — M79604 Pain in right leg: Secondary | ICD-10-CM | POA: Diagnosis not present

## 2020-10-08 DIAGNOSIS — M7989 Other specified soft tissue disorders: Secondary | ICD-10-CM

## 2020-10-14 ENCOUNTER — Ambulatory Visit (HOSPITAL_COMMUNITY)
Admission: RE | Admit: 2020-10-14 | Discharge: 2020-10-14 | Disposition: A | Discharge status: Home / Self Care | Payer: BC Managed Care – PPO | Source: Ambulatory Visit | Attending: Internal Medicine | Admitting: Internal Medicine

## 2020-10-14 ENCOUNTER — Other Ambulatory Visit: Payer: Self-pay

## 2020-10-14 DIAGNOSIS — R252 Cramp and spasm: Secondary | ICD-10-CM | POA: Diagnosis not present

## 2020-10-14 HISTORY — PX: OTHER SURGICAL HISTORY: SHX169

## 2020-10-15 ENCOUNTER — Telehealth: Payer: Self-pay

## 2020-10-15 ENCOUNTER — Encounter: Payer: Self-pay | Admitting: Family Medicine

## 2020-10-15 NOTE — Telephone Encounter (Signed)
-----   Message from Ma Hillock, DO sent at 10/15/2020  7:41 AM EDT ----- Please inform patient the following information: -Her bilateral ABI study shows no signs of arterial disease in either leg and normal blood flow. No further studies needed surrounding her leg discomfort.  Hope she is feeling better.

## 2020-10-15 NOTE — Telephone Encounter (Signed)
Spoke with pt regarding labs and instructions. Pt states there is no improvement

## 2020-10-15 NOTE — Telephone Encounter (Signed)
If there is no improvement, and she is desiring further inform intervention, I recommend she follow-up so we can discuss results and options moving forward.

## 2020-10-15 NOTE — Telephone Encounter (Signed)
Spoke with pt and informed her of provider's instructions. Pt will CB to sched appt

## 2020-10-22 ENCOUNTER — Encounter: Payer: Self-pay | Admitting: Family Medicine

## 2020-10-22 ENCOUNTER — Ambulatory Visit (INDEPENDENT_AMBULATORY_CARE_PROVIDER_SITE_OTHER): Payer: BC Managed Care – PPO | Admitting: Family Medicine

## 2020-10-22 ENCOUNTER — Other Ambulatory Visit: Payer: Self-pay

## 2020-10-22 VITALS — BP 144/90 | HR 90 | Temp 98.0°F | Ht 64.0 in | Wt 210.0 lb

## 2020-10-22 DIAGNOSIS — M7741 Metatarsalgia, right foot: Secondary | ICD-10-CM | POA: Diagnosis not present

## 2020-10-22 DIAGNOSIS — M79671 Pain in right foot: Secondary | ICD-10-CM | POA: Diagnosis not present

## 2020-10-22 DIAGNOSIS — M2141 Flat foot [pes planus] (acquired), right foot: Secondary | ICD-10-CM

## 2020-10-22 DIAGNOSIS — M2142 Flat foot [pes planus] (acquired), left foot: Secondary | ICD-10-CM | POA: Diagnosis not present

## 2020-10-22 MED ORDER — DICLOFENAC SODIUM ER 100 MG PO TB24: 100.0000 mg | ORAL_TABLET | Freq: Every day | ORAL | 3 refills | Status: DC

## 2020-10-22 NOTE — Progress Notes (Signed)
This visit occurred during the SARS-CoV-2 public health emergency.  Safety protocols were in place, including screening questions prior to the visit, additional usage of staff PPE, and extensive cleaning of exam room while observing appropriate contact time as indicated for disinfecting solutions.    Sarah Krause , [birth year], 55 y.o., female MRN: [id number] Patient Care Team    Relationship Specialty Notifications Start End  Ma Hillock, DO PCP - General Family Medicine  08/14/19   Kerney Elbe, MD  Obstetrics and Gynecology  08/17/19   Phylliss Bob, MD Consulting Physician Orthopedic Surgery  08/17/19   Manon Hilding, MD Referring Physician Vascular Surgery  08/17/19   Evelina Bucy, DPM Consulting Physician Podiatry  08/17/19     Chief Complaint  Patient presents with  . Foot Pain    Pt c/o R foot pain that is worsening from last OV     Subjective: Pt presents for an OV with continued complaints of right foot pain.  She reports the pain is felt from the balls of her feet to mid calf.  She recently underwent vascular studies of her right lower extremity to rule out DVT or PAD as potential causes.  Studies essentially normal.  Today she reports the pain is worsening since her last appointment 5 weeks ago.  She does walk on her feet throughout the day in both of her jobs. Body mass index is 36.05 kg/m.   Prior note: complaints of right leg and foot swelling with cramping of 2 weeks duration.  Pt reports the swelling is not present today. She reports being awakened around 4:00 in the morning with a rather significant right posterior calf spasm/cramp that extended down into her foot.  The cramp last about 15 minutes and was relieved by stretching and walking.  She reports since that time she feels her leg is intermittently swollen and her foot seems intermittently swollen.  She feels her shoe feels tight on the right compared to normal.  She feels her calf remains  mildly sore.  She denies any fever, chills, cough or shortness of breath.  She denies any palpitations.  She denies any known trauma to the area prior to onset.  She denies any bruising.  She does admit she will frequently sit on that foot with bended knee.  She works 3 jobs and is on her feet the majority of the day.  She attempts to hydrate but admits she only drinks about 48 ounces of water daily.  She has a h/o varicose veins with surgical correction in 2016 when her right leg.  She has a history of  arthritis of hips, back and knees. She is chronic mobic and skelaxin.  She states the pain does not feel like the pain she is experienced in the past in relation to her back.   Depression screen [facility] 2/9 09/17/2020 08/14/2019  Decreased Interest 0 0  Down, Depressed, Hopeless 0 0  PHQ - 2 Score 0 0    Allergies  Allergen Reactions  . Almond Oil Anaphylaxis and Hives  . Other Swelling     ALMONDS  . Sulfa Antibiotics Hives   Social History   Social History Narrative   Marital status/children/pets: Married.  2 children.   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   Safety:      -Wears a bicycle helmet riding a bike: Yes     -smoke alarm in the home:Yes     - wears  seatbelt: Yes     - Feels safe in their relationships: Yes   Past Medical History:  Diagnosis Date  . Arthritis   . Diverticulitis   . Dry eyes   . Family history of adverse reaction to anesthesia    mother hard time coming out of anesthia-("she's tiny"  . Hemorrhoids   . 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  Marland Kitchen History of kidney stones    passed  . Migraines   . Precancerous skin lesion    Abdomen and arm.  . Symptomatic varicose veins, right 2016   Patient had x2 surgery on varicosities   Past Surgical History:  Procedure  Laterality Date  . ABDOMINAL HYSTERECTOMY  2006   Partial hysterectomy with bladder tact  . CHOLECYSTECTOMY  2004  . COLONOSCOPY     has had 2  as 6//5/19  . image CT abd/pelvis  09/07/2014   Rectosigmoid colonic wall thickening with adjacent fat stranding compatible with diverticulitis.  Mild common duct dilatation in the setting of prior cholecystectomy, felt to be incidental and likely at the upper end of normal range considering no right upper quadrant pain at the time.  . Image MRI:  12/02/2017   L 2-3 mild facet joint arthritis.  L3-4 mild facet joint arthritis.  L4-5 mild facet joint arthritis.  L5-S1 large disc extrusion on the right with compression of the right side of the thecal sac and the right S1 nerve.  . Image: ABI BLE  10/14/2020   Normal studies bilateral lower extremity; no signs of arterial disease  . image: DG right knee  04/19/2011   Mild patellofemoral and medial compartment osteoarthritis. Tiny cortical buckle in the posterior lateral tibial plateau-normal variant.  . image: RLE Doppler  09/2020   Negative for DVT right lower extremity  . image:DG right hip xray  11/09/2017   No acute fracture or malalignment.  Mild bilateral hip degenerative changes.  Mild degenerative disc and facet disease of the lower lumbar spine.  . INCONTINENCE SURGERY  2016   Bladder mesh  . LUMBAR LAMINECTOMY/DECOMPRESSION MICRODISCECTOMY Right 01/05/2018   Procedure: RIGHT SIDED LUMBAR FIVE - SACRUM ONE MICRODISECTOMY;  Surgeon: Phylliss Bob, MD;  Location: [facility];  Service: Orthopedics;  Laterality: Right;  . TONSILLECTOMY  1971  . Varicose veins Right 2016   March & June 2016.   Family History  Problem Relation Age of Onset  . Arthritis Mother   . Colitis Mother   . Macular degeneration Mother   . Heart disease Father   . Heart attack Father        died 53  . Diabetes Brother   . Arthritis Brother   . Hyperlipidemia Brother   . Macular degeneration Brother   . Ulcerative  colitis Sister   . Parkinson's disease Maternal Grandfather   . Parkinson's disease Brother    Allergies as of 10/22/2020      Reactions   Almond Oil Anaphylaxis, Hives   Other Swelling   ALMONDS   Sulfa Antibiotics Hives      Medication List       Accurate as of October 22, 2020 11:59 PM. If you have any questions, ask your nurse or doctor.        STOP taking these medications   meloxicam 15 MG tablet Commonly known as: MOBIC  Stopped by: Howard Pouch, DO   neomycin-polymyxin b-dexamethasone 3.5-10000-0.1 Susp Commonly known as: MAXITROL Stopped by: Howard Pouch, DO     TAKE these medications   Diclofenac Sodium CR 100 MG 24 hr tablet Take 1 tablet (100 mg total) by mouth daily. Started by: Howard Pouch, DO   metaxalone 800 MG tablet Commonly known as: SKELAXIN Take 1 tablet (800 mg total) by mouth 2 (two) times daily as needed for muscle spasms.       All past medical history, surgical history, allergies, family history, immunizations andmedications were updated in the EMR today and reviewed under the history and medication portions of their EMR.     ROS: Negative, with the exception of above mentioned in HPI   Objective:  BP (!) 144/90   Pulse 90   Temp 98 F (36.7 C) (Oral)   Ht 5\' 4"  (1.626 m)   Wt 210 lb (95.3 kg)   SpO2 98%   BMI 36.05 kg/m  Body mass index is 36.05 kg/m. Gen: Afebrile. No acute distress.  HENT: AT. [place].  MSK: Right foot without erythema.  No soft tissue swelling today.  TTP to palpation over distal metatarsal heads of second and third metatarsals.  Mildly inverted left ankle with flattened arch.  No bony tenderness of heel or arch.  Neurovascularly intact distally.  Negative Homans. Skin: No rashes, purpura or petechiae.  Neuro: Walking with mild limp. PERLA. EOMi. Alert. Oriented x3  No exam data present No results found. No results found for this or any previous visit (from the past 24 hour(s)).  Assessment/Plan: Sarah Krause is a 55 y.o. female present for OV for  Metatarsalgia/right foot pain/fallen arch Right foot pain is worsening over the last 7 weeks.  Vascular studies for initial presentation of calf pain have been normal.  Suspect metatarsalgia due to possible arthritis/degeneration etc. as cause of pain in her foot which is creating a compensating gait, leading to her Achilles and calf discomfort. -Stop Mobic, start diclofenac -DC Skelaxin has not been helpful. -X-ray right foot-patient will be called with results If x-ray does not show any obvious causes such as fracture etc., then would refer to podiatry for consideration of orthotics an/or scaphoid/metatarsal pads etc.  Patient admitted today that she did have orthotics made in the past.   Reviewed expectations re: course of current medical issues.  Discussed self-management of symptoms.  Outlined signs and symptoms indicating need for more acute intervention.  Patient verbalized understanding and all questions were answered.  Patient received an After-Visit Summary.    Orders Placed This Encounter  Procedures  . DG Foot Complete Right   Meds ordered this encounter  Medications  . Diclofenac Sodium CR 100 MG 24 hr tablet    Sig: Take 1 tablet (100 mg total) by mouth daily.    Dispense:  90 tablet    Refill:  3   Referral Orders  No referral(s) requested today     Note is dictated utilizing voice recognition software. Although note has been proof read prior to signing, occasional typographical errors still can be missed. If any questions arise, please do not hesitate to call for verification.   electronically signed by:  Howard Pouch, DO  Page

## 2020-10-28 ENCOUNTER — Encounter: Payer: Self-pay | Admitting: Family Medicine

## 2020-10-28 ENCOUNTER — Other Ambulatory Visit: Payer: Self-pay

## 2020-10-28 ENCOUNTER — Ambulatory Visit
Admission: RE | Admit: 2020-10-28 | Discharge: 2020-10-28 | Disposition: A | Discharge status: Home / Self Care | Payer: BC Managed Care – PPO | Source: Ambulatory Visit | Attending: Family Medicine | Admitting: Family Medicine

## 2020-10-28 DIAGNOSIS — M2142 Flat foot [pes planus] (acquired), left foot: Secondary | ICD-10-CM

## 2020-10-28 DIAGNOSIS — M79671 Pain in right foot: Secondary | ICD-10-CM | POA: Diagnosis not present

## 2020-10-28 DIAGNOSIS — M2141 Flat foot [pes planus] (acquired), right foot: Secondary | ICD-10-CM

## 2020-10-29 ENCOUNTER — Telehealth: Payer: Self-pay | Admitting: Family Medicine

## 2020-10-29 DIAGNOSIS — M7741 Metatarsalgia, right foot: Secondary | ICD-10-CM

## 2020-10-29 NOTE — Telephone Encounter (Signed)
Please inform patient the following information: Her xray is normal of her foot.  Next step is to refer her to either podiatry so they can try to fit her for scaphoid/metarsal pads and/or orthotics. I have placed this referral for her.

## 2020-10-29 NOTE — Telephone Encounter (Signed)
Spoke with pt regarding labs and instructions.   

## 2020-11-11 ENCOUNTER — Other Ambulatory Visit: Payer: Self-pay

## 2020-11-11 ENCOUNTER — Ambulatory Visit (INDEPENDENT_AMBULATORY_CARE_PROVIDER_SITE_OTHER): Payer: BC Managed Care – PPO | Admitting: Podiatry

## 2020-11-11 ENCOUNTER — Ambulatory Visit (INDEPENDENT_AMBULATORY_CARE_PROVIDER_SITE_OTHER): Payer: BC Managed Care – PPO

## 2020-11-11 DIAGNOSIS — M79671 Pain in right foot: Secondary | ICD-10-CM | POA: Diagnosis not present

## 2020-11-11 DIAGNOSIS — M778 Other enthesopathies, not elsewhere classified: Secondary | ICD-10-CM | POA: Diagnosis not present

## 2020-11-13 DIAGNOSIS — M79671 Pain in right foot: Secondary | ICD-10-CM | POA: Diagnosis not present

## 2020-11-13 DIAGNOSIS — M778 Other enthesopathies, not elsewhere classified: Secondary | ICD-10-CM | POA: Diagnosis not present

## 2020-11-13 MED ORDER — TRIAMCINOLONE ACETONIDE 10 MG/ML IJ SUSP
10.0000 mg | Freq: Once | INTRAMUSCULAR | Status: AC
  Administered 2020-11-13: 10 mg

## 2020-11-13 NOTE — Progress Notes (Signed)
Subjective:   Patient ID: Sarah Krause, female   DOB: 55 y.o.   MRN: [id number]   HPI Patient states she is developed pain in the front of her right foot and its been sore and making it hard for her to walk.  Patient does not remember specific injury and patient likes to be active   Review of Systems  All other systems reviewed and are negative.       Objective:  Physical Exam Vitals and nursing note reviewed.  Constitutional:      Appearance: She is well-developed.  Pulmonary:     Effort: Pulmonary effort is normal.  Musculoskeletal:        General: Normal range of motion.  Skin:    General: Skin is warm.  Neurological:     Mental Status: She is alert.     Neurovascular status found to be intact muscle strength found to be adequate range of motion found to be within normal limits.  Patient is found to have inflammation pain of the second MPJ right with fluid buildup around the joint surface and discomfort with pressure.  Patient has good digital perfusion well oriented x3     Assessment:  Inflammatory capsulitis second MPJ right     Plan:  H&P reviewed condition and x-ray and today I did anesthetic block right forefoot aspirated the second MPJ getting a small amount of clear fluid injected quarter cc dexamethasone Kenalog advised on plantar padding and reappoint to recheck  X-rays were negative for signs of fracture or bony pathology

## 2021-01-01 HISTORY — PX: FOOT SURGERY: SHX648

## 2021-01-02 ENCOUNTER — Other Ambulatory Visit: Payer: Self-pay

## 2021-01-02 ENCOUNTER — Encounter: Payer: Self-pay | Admitting: Podiatry

## 2021-01-02 ENCOUNTER — Ambulatory Visit (INDEPENDENT_AMBULATORY_CARE_PROVIDER_SITE_OTHER): Payer: BC Managed Care – PPO | Admitting: Podiatry

## 2021-01-02 DIAGNOSIS — D169 Benign neoplasm of bone and articular cartilage, unspecified: Secondary | ICD-10-CM | POA: Diagnosis not present

## 2021-01-02 DIAGNOSIS — M216X1 Other acquired deformities of right foot: Secondary | ICD-10-CM

## 2021-01-02 DIAGNOSIS — M2041 Other hammer toe(s) (acquired), right foot: Secondary | ICD-10-CM | POA: Diagnosis not present

## 2021-01-02 DIAGNOSIS — M216X9 Other acquired deformities of unspecified foot: Secondary | ICD-10-CM

## 2021-01-02 NOTE — Progress Notes (Signed)
Subjective:   Patient ID: Sarah Krause, female   DOB: 55 y.o.   MRN: [id number]   HPI Patient presents stating that she is having a lot of pain in her right foot and she needs to have this corrected.  Points to the fifth digit fourth digit and states the second metatarsal has started to become very painful to get   ROS      Objective:  Physical Exam  Neurovascular status intact with exquisite discomfort around the fifth digit right medial side fourth digit with an enlarged head of proximal phalanx and exquisite discomfort in the second metatarsal phalangeal joint right with inflammation fluid     Assessment:  Chronic capsulitis second MPJ exostosis fifth digit hammertoe deformity fourth right     Plan:  H&P reviewed all conditions recommended exostectomy arthroplasty and shortening osteotomy explained procedures and allowed her to read consent form going over alternative treatments complications.  Patient wants surgery understanding everything is outlined is willing to accept risk and after extensive review signed consent form and is scheduled for outpatient surgery.  Patient is encouraged to call questions concerns which may arise and had air fracture walker dispensed that I want her to start wearing preoperatively to get used to it and then can wear postoperative

## 2021-01-06 ENCOUNTER — Telehealth: Payer: Self-pay | Admitting: Urology

## 2021-01-06 NOTE — Telephone Encounter (Signed)
DOS - 01/14/21  METATARSAL OSTEO 2ND RIGHT --- 28308 HAMMERTOE REPAIR 4TH RIGHT --- 11886 EXOSTECTOMY 5TH RIGHT --- 77373   BCBS EFFECTIVE DATE - 08/05/19   PLAN DEDUCTIBLE - $3,400.00 W/ $564.00 REMAINING OUT OF POCKET - $4,000.00 W/ $1,931.00 REMAINING COINSURANCE - 0% COPAY - $0.00    SPOKE WITH MARY D WITH [employer] AND SHE STATED THAT FOR CPT CODES 66815, [phone] AND 61518 NO PRIOR AUTH IS REQUIRED.   REF # [id number]

## 2021-01-13 MED ORDER — HYDROCODONE-ACETAMINOPHEN 10-325 MG PO TABS: 1.0000 | ORAL_TABLET | Freq: Four times a day (QID) | ORAL | 0 refills | Status: AC | PRN

## 2021-01-13 NOTE — Addendum Note (Signed)
Addended by: Wallene Huh on: 01/13/2021 05:21 PM   Modules accepted: Orders

## 2021-01-14 ENCOUNTER — Encounter: Payer: Self-pay | Admitting: Podiatry

## 2021-01-14 DIAGNOSIS — M898X7 Other specified disorders of bone, ankle and foot: Secondary | ICD-10-CM | POA: Diagnosis not present

## 2021-01-14 DIAGNOSIS — M2041 Other hammer toe(s) (acquired), right foot: Secondary | ICD-10-CM | POA: Diagnosis not present

## 2021-01-14 DIAGNOSIS — D169 Benign neoplasm of bone and articular cartilage, unspecified: Secondary | ICD-10-CM | POA: Diagnosis not present

## 2021-01-14 DIAGNOSIS — Q6689 Other  specified congenital deformities of feet: Secondary | ICD-10-CM | POA: Diagnosis not present

## 2021-01-14 DIAGNOSIS — M21541 Acquired clubfoot, right foot: Secondary | ICD-10-CM | POA: Diagnosis not present

## 2021-01-20 ENCOUNTER — Ambulatory Visit (INDEPENDENT_AMBULATORY_CARE_PROVIDER_SITE_OTHER): Payer: BC Managed Care – PPO | Admitting: Podiatry

## 2021-01-20 ENCOUNTER — Ambulatory Visit (INDEPENDENT_AMBULATORY_CARE_PROVIDER_SITE_OTHER): Payer: BC Managed Care – PPO

## 2021-01-20 ENCOUNTER — Other Ambulatory Visit: Payer: Self-pay

## 2021-01-20 DIAGNOSIS — M2041 Other hammer toe(s) (acquired), right foot: Secondary | ICD-10-CM | POA: Diagnosis not present

## 2021-01-20 DIAGNOSIS — D169 Benign neoplasm of bone and articular cartilage, unspecified: Secondary | ICD-10-CM

## 2021-01-22 NOTE — Progress Notes (Signed)
Subjective:   Patient ID: Sarah Krause, female   DOB: 55 y.o.   MRN: [id number]   HPI Patient states doing well with my right foot stating that she has minimal discomfort and is walking with her boot without pain   ROS      Objective:  Physical Exam  Neurovascular status intact negative Bevelyn Buckles' sign noted right foot healed well wound edges well coapted digits in good alignment     Assessment:  Doing well post osteotomy right second metatarsal and digital procedure fourth and fifth digit right with stitches intact     Plan:  H&P reviewed condition recommended the continuation of immobilization elevation compression sterile dressing reapplied reappoint 2 weeks suture removal earlier if needed  X-rays indicate good alignment screw in place good structural correction removal of bone

## 2021-02-06 ENCOUNTER — Ambulatory Visit (INDEPENDENT_AMBULATORY_CARE_PROVIDER_SITE_OTHER): Payer: BC Managed Care – PPO | Admitting: Podiatry

## 2021-02-06 ENCOUNTER — Encounter: Payer: Self-pay | Admitting: Podiatry

## 2021-02-06 ENCOUNTER — Ambulatory Visit (INDEPENDENT_AMBULATORY_CARE_PROVIDER_SITE_OTHER): Payer: BC Managed Care – PPO

## 2021-02-06 ENCOUNTER — Other Ambulatory Visit: Payer: Self-pay

## 2021-02-06 DIAGNOSIS — Z9889 Other specified postprocedural states: Secondary | ICD-10-CM

## 2021-02-06 DIAGNOSIS — D169 Benign neoplasm of bone and articular cartilage, unspecified: Secondary | ICD-10-CM

## 2021-02-06 DIAGNOSIS — M2041 Other hammer toe(s) (acquired), right foot: Secondary | ICD-10-CM

## 2021-02-18 NOTE — Progress Notes (Signed)
Subjective:   Patient ID: Sarah Krause, female   DOB: 55 y.o.   MRN: [id number]   HPI Patient states doing well with surgery pleased so far   ROS      Objective:  Physical Exam  Neurovascular status intact with patient found to have good alignment of the fourth and fifth digit right stitches intact wound edges well coapted good alignment noted     Assessment:  Doing well post arthroplasty exostectomy     Plan:  H&P reviewed condition went ahead today and removed stitches wound edges coapted well advised on gradual return to soft shoe gear and increased activity  Satisfactory resection of bone good alignment noted to the adjacent digits

## 2021-02-26 ENCOUNTER — Ambulatory Visit: Payer: BC Managed Care – PPO | Admitting: Podiatry

## 2021-05-27 ENCOUNTER — Other Ambulatory Visit: Payer: Self-pay | Admitting: Family Medicine

## 2021-05-27 ENCOUNTER — Encounter: Payer: Self-pay | Admitting: Family Medicine

## 2021-05-27 ENCOUNTER — Telehealth: Payer: Self-pay | Admitting: Family Medicine

## 2021-05-27 ENCOUNTER — Other Ambulatory Visit: Payer: Self-pay

## 2021-05-27 ENCOUNTER — Telehealth (INDEPENDENT_AMBULATORY_CARE_PROVIDER_SITE_OTHER): Payer: 59 | Admitting: Family Medicine

## 2021-05-27 DIAGNOSIS — H9201 Otalgia, right ear: Secondary | ICD-10-CM

## 2021-05-27 DIAGNOSIS — R0989 Other specified symptoms and signs involving the circulatory and respiratory systems: Secondary | ICD-10-CM

## 2021-05-27 DIAGNOSIS — J029 Acute pharyngitis, unspecified: Secondary | ICD-10-CM

## 2021-05-27 LAB — POCT INFLUENZA A/B
Influenza A, POC: NEGATIVE
Influenza B, POC: NEGATIVE

## 2021-05-27 LAB — POCT RAPID STREP A (OFFICE): Rapid Strep A Screen: NEGATIVE

## 2021-05-27 MED ORDER — AMOXICILLIN-POT CLAVULANATE 875-125 MG PO TABS: 1.0000 | ORAL_TABLET | Freq: Two times a day (BID) | ORAL | 0 refills | Status: DC

## 2021-05-27 NOTE — Telephone Encounter (Signed)
Spoke with pt regarding results/recommendations,voiced understanding. ? ?

## 2021-05-27 NOTE — Telephone Encounter (Signed)
Please call patient: Rapid strep and influenza test both are negative. COVID test will result within 12 to 24 hours.  Would encourage her to start antihistamine of choice OTC.  I have called in Augmentin twice daily x10 days to start treatment for sinus and ear symptoms/infection.   Thanks.

## 2021-05-27 NOTE — Progress Notes (Signed)
   VIRTUAL VISIT VIA VIDEO  I connected with Sarah Krause on 05/27/21 at 11:30 AM EDT by elemedicine application and verified that I am speaking with the correct person using two identifiers. Location patient: Home Location provider: [facility], Office Persons participating in the virtual visit: Patient, Dr. Raoul Pitch and Darnell Level. Cesar, CMA  I discussed the limitations of evaluation and management by telemedicine and the availability of in person appointments. The patient expressed understanding and agreed to proceed.   SUBJECTIVE Chief Complaint  Patient presents with   Sore Throat    Pt c/o R sore throat and earache x 4 days;     HPI: Sarah Krause is a 55 y.o. female present for acute illness of 4 days. Pt endorses sore throat and earache.  Exposure:none that she is aware of.  Sx start: 4 days ago.  Vaccine:J&J x1 Pt denies fever, chills, nausea, vomit, diarrhea.  Otc: tylenol.  GFR: WNL Anticoag: n/a  ROS: See pertinent positives and negatives per HPI.  Patient Active Problem List   Diagnosis Date Noted   Skin lesion 02/28/2020   Female stress incontinence 08/17/2019   Primary osteoarthritis of right knee 08/17/2019   Bursitis of both hips 08/17/2019   Primary osteoarthritis of right hip 08/17/2019   Lumbar degenerative disc disease 08/17/2019   Facet arthropathy, lumbar 08/17/2019   Vaginal vault prolapse 01/22/2015   Varicose veins of leg with complications 26/20/3559   Varicose veins of lower extremities with other complications 74/16/3845    Social History   Tobacco Use   Smoking status: Never   Smokeless tobacco: Never  Substance Use Topics   Alcohol use: Yes    Comment: "May 1 a Month"    Current Outpatient Medications:    amoxicillin-clavulanate (AUGMENTIN) 875-125 MG tablet, Take 1 tablet by mouth 2 (two) times daily., Disp: 20 tablet, Rfl: 0   Diclofenac Sodium CR 100 MG 24 hr tablet, Take 1 tablet (100 mg total) by mouth daily., Disp: 90  tablet, Rfl: 3  Allergies  Allergen Reactions   Almond Oil Anaphylaxis and Hives   Other Swelling     ALMONDS   Sulfa Antibiotics Hives    OBJECTIVE: There were no vitals taken for this visit. Gen: No acute distress.  Chest: Cough not present. Skin: Patient denies rashes, purpura or petechiae.  Neuro: Alert. Oriented x3   ASSESSMENT AND PLAN: Sarah Krause is a 55 y.o. female present for  Pharyngitis, unspecified etiology/ear pain/upper respiratory infection Drive-through testing completed: - Novel Coronavirus, NAA ([facility]); Future> pending - POCT rapid strep A; Future> negative - POCT Influenza A/B; Future> negative - Upper Respiratory Culture> pending Rest, hydrate.  nettie pot or nasal saline may be helpful Augmentin twice daily prescribed, take until completed.  Start OTC antihistamine of choice F/U 2 weeks if not improved.    Howard Pouch, DO 05/27/2021   No follow-ups on file.  Orders Placed This Encounter  Procedures   Novel Coronavirus, NAA ([facility])   Upper Respiratory Culture   POCT rapid strep A   POCT Influenza A/B   Meds ordered this encounter  Medications   amoxicillin-clavulanate (AUGMENTIN) 875-125 MG tablet    Sig: Take 1 tablet by mouth 2 (two) times daily.    Dispense:  20 tablet    Refill:  0   Referral Orders  No referral(s) requested today

## 2021-05-28 NOTE — Telephone Encounter (Signed)
Spoke with pt with provider's instructions.   ?

## 2021-05-28 NOTE — Telephone Encounter (Signed)
Replaced Augmentin with doxycyline. Which is also every 12 hours for 10 days. Please advise her to take with food, to avoid nausea (but not a calcium based food).   Thanks.

## 2021-05-28 NOTE — Telephone Encounter (Signed)
Please advise on alt med.  

## 2021-05-29 LAB — SARS-COV-2, NAA 2 DAY TAT

## 2021-05-29 LAB — NOVEL CORONAVIRUS, NAA: SARS-CoV-2, NAA: NOT DETECTED

## 2021-05-30 LAB — CULTURE, UPPER RESPIRATORY
MICRO NUMBER:: 12550627
SPECIMEN QUALITY:: ADEQUATE

## 2021-07-30 ENCOUNTER — Telehealth: Payer: Self-pay | Admitting: Family Medicine

## 2021-08-03 DIAGNOSIS — U071 COVID-19: Secondary | ICD-10-CM

## 2021-08-03 HISTORY — DX: COVID-19: U07.1

## 2021-08-04 ENCOUNTER — Encounter (INDEPENDENT_AMBULATORY_CARE_PROVIDER_SITE_OTHER): Payer: Self-pay

## 2021-08-04 ENCOUNTER — Telehealth: Payer: 59 | Admitting: Physician Assistant

## 2021-08-04 DIAGNOSIS — U071 COVID-19: Secondary | ICD-10-CM | POA: Diagnosis not present

## 2021-08-04 MED ORDER — IPRATROPIUM BROMIDE 0.03 % NA SOLN: 2.0000 | Freq: Two times a day (BID) | NASAL | 0 refills | Status: DC

## 2021-08-04 MED ORDER — BENZONATATE 100 MG PO CAPS
100.0000 mg | ORAL_CAPSULE | Freq: Three times a day (TID) | ORAL | 0 refills | Status: DC | PRN
Start: 2021-08-04 — End: 2021-11-20

## 2021-08-04 NOTE — Progress Notes (Signed)
E-Visit  for Positive Covid Test Result  We are sorry you are not feeling well. We are here to help!  You have tested positive for COVID-19, meaning that you were infected with the novel coronavirus and could give the virus to others.  It is vitally important that you stay home so you do not spread it to others.      Please continue isolation at home, for at least 10 days since the start of your symptoms and until you have had 24 hours with no fever (without taking a fever reducer) and with improving of symptoms.  If you have no symptoms but tested positive (or all symptoms resolve after 5 days and you have no fever) you can leave your house but continue to wear a mask around others for an additional 5 days. If you have a fever,continue to stay home until you have had 24 hours of no fever. Most cases improve 5-10 days from onset but we have seen a small number of patients who have gotten worse after the 10 days.  Please be sure to watch for worsening symptoms and remain taking the proper precautions.   Go to the nearest hospital ED for assessment if fever/cough/breathlessness are severe or illness seems like a threat to life.    The following symptoms may appear 2-14 days after exposure: Fever Cough Shortness of breath or difficulty breathing Chills Repeated shaking with chills Muscle pain Headache Sore throat New loss of taste or smell Fatigue Congestion or runny nose Nausea or vomiting Diarrhea  You have been enrolled in [facility] for COVID-19. Daily you will receive a questionnaire within the [facility] website. Our COVID-19 response team will be monitoring your responses daily.  You can use medication such as prescription cough medication called Tessalon Perles 100 mg. You may take 1-2 capsules every 8 hours as needed for cough and a nasal spray Ipratropium Bromide 2 sprays each nostril twice daily for 5-7 days as needed for congestion.   You may also take  acetaminophen (Tylenol) as needed for fever.  You are out of the window to start any anti-viral treatment, if that had been desired, as these have to be started within 5 days of symptom onset. Meaning you would have had to started this by 07/30/21.  HOME CARE: Only take medications as instructed by your medical team. Drink plenty of fluids and get plenty of rest. A steam or ultrasonic humidifier can help if you have congestion.   GET HELP RIGHT AWAY IF YOU HAVE EMERGENCY WARNING SIGNS.  Call 911 or proceed to your closest emergency facility if: You develop worsening high fever. Trouble breathing Bluish lips or face Persistent pain or pressure in the chest New confusion Inability to wake or stay awake You cough up blood. Your symptoms become more severe Inability to hold down food or fluids  This list is not all possible symptoms. Contact your medical provider for any symptoms that are severe or concerning to you.    Your e-visit answers were reviewed by a board certified advanced clinical practitioner to complete your personal care plan.  Depending on the condition, your plan could have included both over the counter or prescription medications.  If there is a problem please reply once you have received a response from your provider.  Your safety is important to [place].  If you have drug allergies check your prescription carefully.    You can use MyChart to ask questions about today's visit, request  a non-urgent call back, or ask for a work or school excuse for 24 hours related to this e-Visit. If it has been greater than 24 hours you will need to follow up with your provider, or enter a new e-Visit to address those concerns. You will get an e-mail in the next two days asking about your experience.  I hope that your e-visit has been valuable and will speed your recovery. Thank you for using e-visits.   I provided 5 minutes of non face-to-face time during this encounter for chart review  and documentation.

## 2021-08-20 ENCOUNTER — Telehealth: Payer: Self-pay | Admitting: Family Medicine

## 2021-08-20 DIAGNOSIS — U071 COVID-19: Secondary | ICD-10-CM

## 2021-08-20 NOTE — Telephone Encounter (Signed)
Pt came in office on 08/18/2021.  Pt have Covid she was out of work on from December 23 to Jan 10.  Her employer is saying that her Return to Work Release Form needs to be signed by her provider in order for her to receive [place]: [phone] .--KR  Placed Form in Dr Raliegh Ip basket upfront on 08/20/2021.--KR

## 2021-08-20 NOTE — Telephone Encounter (Signed)
Pt seen by e-visit (not PCP) on 08/04/21 for COVID+. Please advise if okay to complete. Pt states in fax that she was told that she did not need an appt for + COVID test (07/28/21)

## 2021-08-21 NOTE — Telephone Encounter (Signed)
Please advise or assist

## 2021-08-21 NOTE — Telephone Encounter (Signed)
I am not certain who would fill out her FMLA form for her COVID illness/absence. She was seen by E-visit through [facility] and checked in daily through her MyChart with COVID conditioning monitoring questionnaire per EMR review.  I am not certain who reviewed her answers or provided her with recommendations on returning to work. This office and provider did not test for her illness, we did not treat her for her illness, we did not see her for this illness and we did not provide her with any recommendations on absence from work.  Therefore, we really cannot fill out the papers for her, but there has to be somebody that can that was reviewing her records and providing her recommendations.  I am just not sure who that is.  She unfortunately is coming up on a deadline in which these need to be returned by 08/25/2020.  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

## 2021-08-21 NOTE — Telephone Encounter (Signed)
Please assist with completion. I can fax forms to you/your cma. If you can provide a number for me.

## 2021-08-22 NOTE — Telephone Encounter (Signed)
Unfortunately, the [facility] team has no CMAs and are unable to complete FMLA paperwork as we do not work in [employer] with capabilities to get paperwork back to patients.  We could provide a work note saying that the patient was seen virtually on 08/04/21 and was positive for Covid with the CDC recommended dates of return. Not sure if that would help.

## 2021-08-22 NOTE — Telephone Encounter (Addendum)
Unfortunately, this patient should have been seen for this condition by this provider-PCP in order for me to appropriately fill out her papers.   I did not guide her on this illness or make any recommendations. Patient reported her timeout was July 25, 2021-August 12, 2021.  An 18-day absence for nonhospitalized COVID, far exceeds CDC recommendations of maximum 10 days without complications. Patient reported onset of symptoms July 25, 2021, but sought care by a non-face-to-face E-chart visit 08/04/2021 by another provider with proof of positive home test.   I did go ahead and fill out her FMLA papers with the information that I could collect throughout her chart.  The future if this provider is to fill out FMLA papers, the patient will need to be seen for the illness.

## 2021-08-25 NOTE — Telephone Encounter (Signed)
Faxing form. LVM for pt to CB regarding paperwork.

## 2021-08-25 NOTE — Telephone Encounter (Signed)
Spoke with pt regarding provider's instructions for FMLA paperwork.

## 2021-11-10 NOTE — Telephone Encounter (Signed)
ERROR

## 2021-11-17 IMAGING — DX DG PELVIS 1-2V
1 series · 1 of 1 positions shown · non-contrast
Comparison: None.

CLINICAL DATA: Bilateral chronic pain. History of bursitis.

EXAM:
PELVIS - 1-2 VIEW

[pelvis ap]
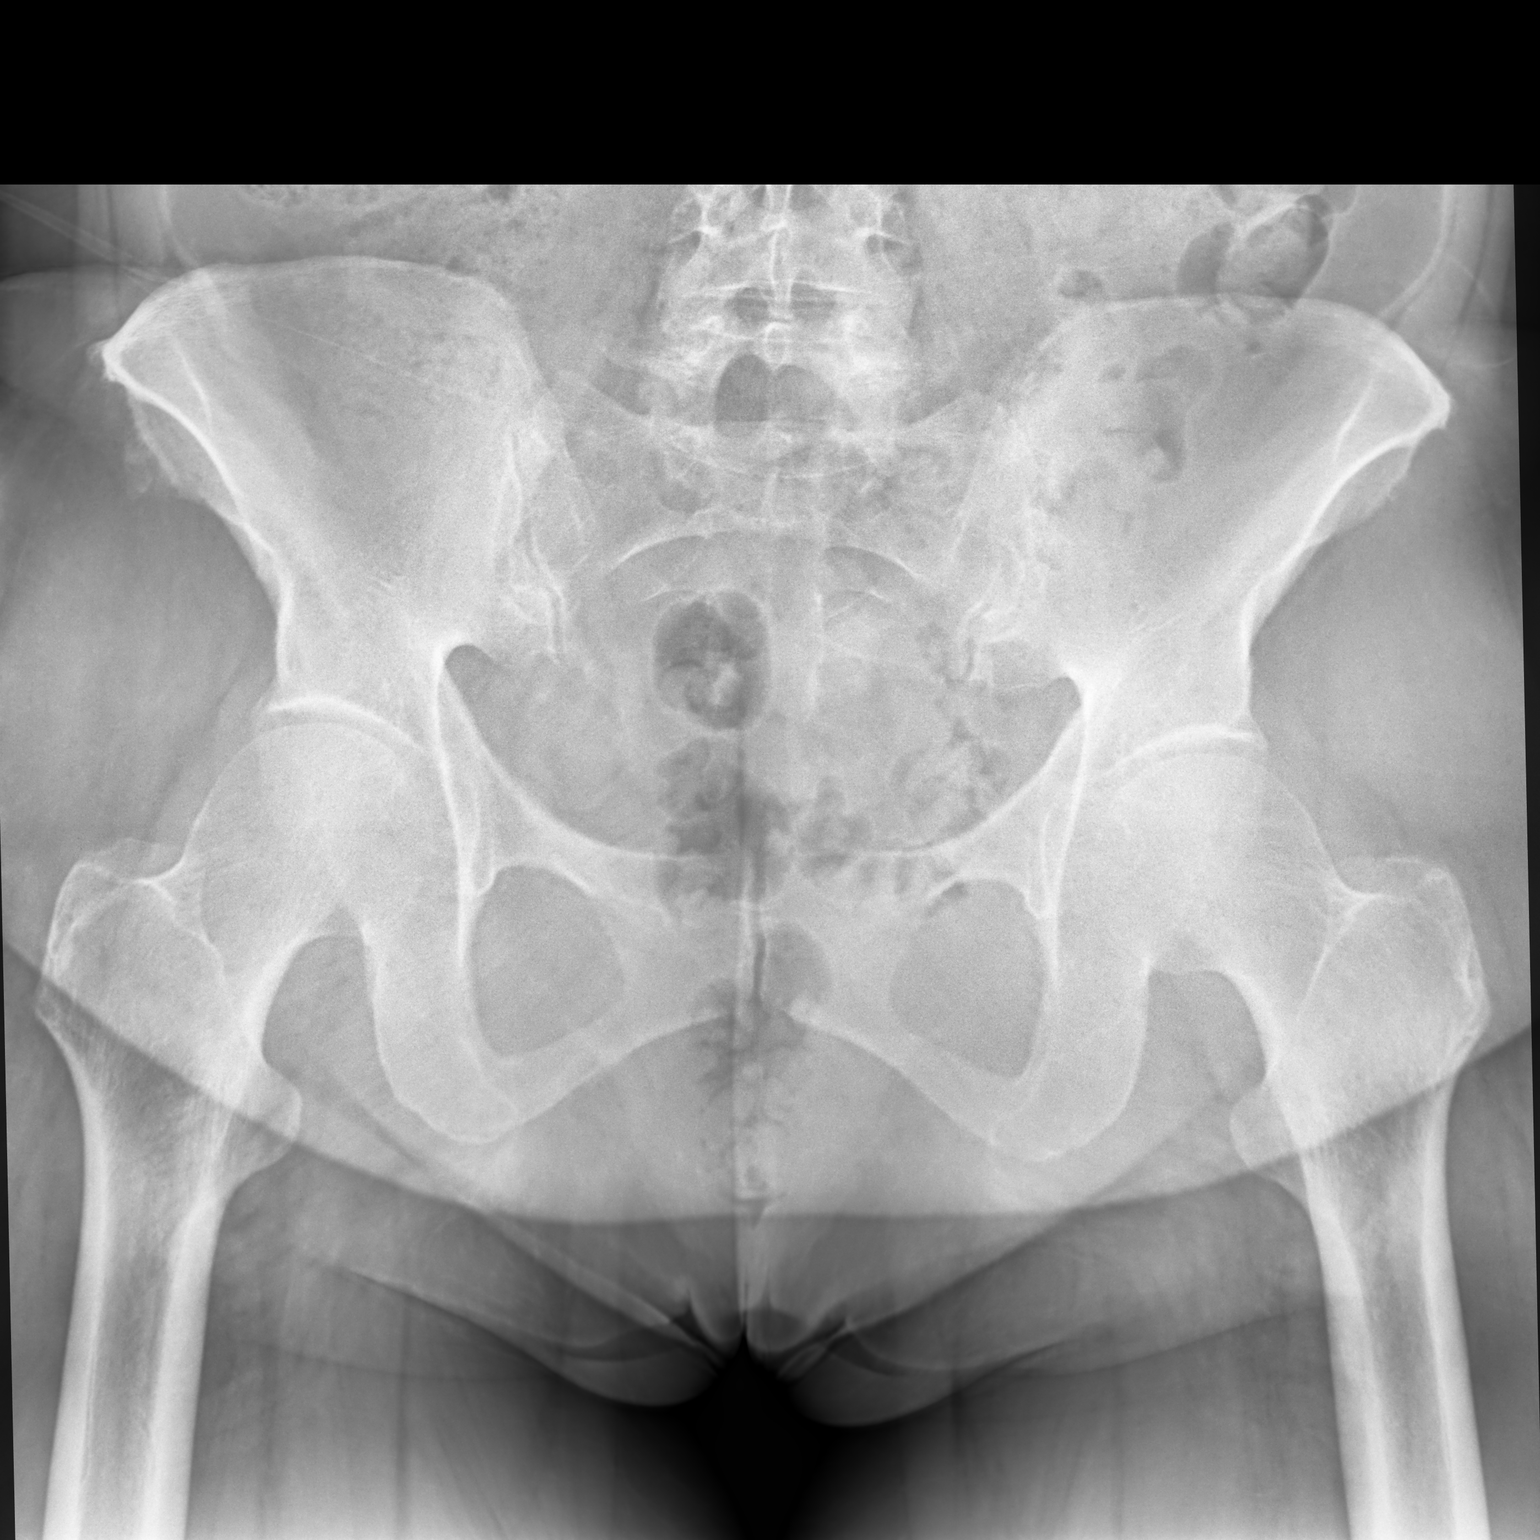

[1 of 1 positions shown; findings below may reference images not displayed]

FINDINGS: Mild bilateral hip osteoarthritis. No fractures or dislocations
identified. No radiopaque foreign body or soft tissue
calcifications.
IMPRESSION: Mild bilateral hip osteoarthritis.

## 2021-11-21 ENCOUNTER — Ambulatory Visit (INDEPENDENT_AMBULATORY_CARE_PROVIDER_SITE_OTHER): Payer: BC Managed Care – PPO | Admitting: Family Medicine

## 2021-11-21 ENCOUNTER — Encounter: Payer: Self-pay | Admitting: Family Medicine

## 2021-11-21 VITALS — BP 122/82 | HR 63 | Temp 98.1°F | Ht 63.5 in | Wt 209.0 lb

## 2021-11-21 DIAGNOSIS — Z1159 Encounter for screening for other viral diseases: Secondary | ICD-10-CM

## 2021-11-21 DIAGNOSIS — M1611 Unilateral primary osteoarthritis, right hip: Secondary | ICD-10-CM

## 2021-11-21 DIAGNOSIS — Z8249 Family history of ischemic heart disease and other diseases of the circulatory system: Secondary | ICD-10-CM | POA: Diagnosis not present

## 2021-11-21 DIAGNOSIS — M5136 Other intervertebral disc degeneration, lumbar region: Secondary | ICD-10-CM

## 2021-11-21 DIAGNOSIS — E669 Obesity, unspecified: Secondary | ICD-10-CM | POA: Diagnosis not present

## 2021-11-21 DIAGNOSIS — M47816 Spondylosis without myelopathy or radiculopathy, lumbar region: Secondary | ICD-10-CM

## 2021-11-21 DIAGNOSIS — Z5320 Procedure and treatment not carried out because of patient's decision for unspecified reasons: Secondary | ICD-10-CM | POA: Insufficient documentation

## 2021-11-21 DIAGNOSIS — Z0000 Encounter for general adult medical examination without abnormal findings: Secondary | ICD-10-CM | POA: Diagnosis not present

## 2021-11-21 DIAGNOSIS — Z9229 Personal history of other drug therapy: Secondary | ICD-10-CM

## 2021-11-21 DIAGNOSIS — R635 Abnormal weight gain: Secondary | ICD-10-CM | POA: Diagnosis not present

## 2021-11-21 DIAGNOSIS — Z2821 Immunization not carried out because of patient refusal: Secondary | ICD-10-CM

## 2021-11-21 DIAGNOSIS — L989 Disorder of the skin and subcutaneous tissue, unspecified: Secondary | ICD-10-CM

## 2021-11-21 LAB — COMPREHENSIVE METABOLIC PANEL
ALT: 47 U/L — ABNORMAL HIGH (ref 0–35)
AST: 30 U/L (ref 0–37)
Albumin: 4.1 g/dL (ref 3.5–5.2)
Alkaline Phosphatase: 96 U/L (ref 39–117)
BUN: 16 mg/dL (ref 6–23)
CO2: 25 mEq/L (ref 19–32)
Calcium: 9 mg/dL (ref 8.4–10.5)
Chloride: 105 mEq/L (ref 96–112)
Creatinine, Ser: 0.69 mg/dL (ref 0.40–1.20)
GFR: 95.88 mL/min (ref >60.00)
Glucose, Bld: 90 mg/dL (ref 70–99)
Potassium: 4 mEq/L (ref 3.5–5.1)
Sodium: 140 mEq/L (ref 135–145)
Total Bilirubin: 0.4 mg/dL (ref 0.2–1.2)
Total Protein: 6.6 g/dL (ref 6.0–8.3)

## 2021-11-21 LAB — TSH: TSH: 1.76 u[IU]/mL (ref 0.35–5.50)

## 2021-11-21 LAB — CBC
HCT: 41.9 % (ref 36.0–46.0)
Hemoglobin: 13.9 g/dL (ref 12.0–15.0)
MCHC: 33.2 g/dL (ref 30.0–36.0)
MCV: 85.3 fl (ref 78.0–100.0)
Platelets: 193 10*3/uL (ref 150.0–400.0)
RBC: 4.91 Mil/uL (ref 3.87–5.11)
RDW: 13.9 % (ref 11.5–15.5)
WBC: 3.5 10*3/uL — ABNORMAL LOW (ref 4.0–10.5)

## 2021-11-21 LAB — LIPID PANEL
Cholesterol: 202 mg/dL — ABNORMAL HIGH (ref 0–200)
HDL: 47.9 mg/dL (ref >39.00)
LDL Cholesterol: 128 mg/dL — ABNORMAL HIGH (ref 0–99)
NonHDL: 154.01
Total CHOL/HDL Ratio: 4
Triglycerides: 131 mg/dL (ref 0.0–149.0)
VLDL: 26.2 mg/dL (ref 0.0–40.0)

## 2021-11-21 LAB — HEMOGLOBIN A1C: Hgb A1c MFr Bld: 5.6 % (ref 4.6–6.5)

## 2021-11-21 MED ORDER — DICLOFENAC SODIUM ER 100 MG PO TB24: 100.0000 mg | ORAL_TABLET | Freq: Every day | ORAL | 3 refills | Status: DC

## 2021-11-21 NOTE — Patient Instructions (Signed)
?Great to see you today.  ?I have refilled the medication(s) we provide.  ?I would be glad to help you in your weight loss journey. Please make an appointment at your earliest convenience for counseling. Bring with you a 1-2 week food/drink diary of everything you consume during that period.  ? ? ?If labs were collected, we will inform you of lab results once received either by echart message or telephone call.  ? - echart message- for normal results that have been seen by the patient already.  ? - telephone call: abnormal results or if patient has not viewed results in their echart. ? ? ?Health Maintenance, Female ?Adopting a healthy lifestyle and getting preventive care are important in promoting health and wellness. Ask your health care provider about: ?The right schedule for you to have regular tests and exams. ?Things you can do on your own to prevent diseases and keep yourself healthy. ?What should I know about diet, weight, and exercise? ?Eat a healthy diet ? ?Eat a diet that includes plenty of vegetables, fruits, low-fat dairy products, and lean protein. ?Do not eat a lot of foods that are high in solid fats, added sugars, or sodium. ?Maintain a healthy weight ?Body mass index (BMI) is used to identify weight problems. It estimates body fat based on height and weight. Your health care provider can help determine your BMI and help you achieve or maintain a healthy weight. ?Get regular exercise ?Get regular exercise. This is one of the most important things you can do for your health. Most adults should: ?Exercise for at least 150 minutes each week. The exercise should increase your heart rate and make you sweat (moderate-intensity exercise). ?Do strengthening exercises at least twice a week. This is in addition to the moderate-intensity exercise. ?Spend less time sitting. Even light physical activity can be beneficial. ?Watch cholesterol and blood lipids ?Have your blood tested for lipids and cholesterol at  58 years of age, then have this test every 5 years. ?Have your cholesterol levels checked more often if: ?Your lipid or cholesterol levels are high. ?You are older than 58 years of age. ?You are at high risk for heart disease. ?What should I know about cancer screening? ?Depending on your health history and family history, you may need to have cancer screening at various ages. This may include screening for: ?Breast cancer. ?Cervical cancer. ?Colorectal cancer. ?Skin cancer. ?Lung cancer. ?What should I know about heart disease, diabetes, and high blood pressure? ?Blood pressure and heart disease ?High blood pressure causes heart disease and increases the risk of stroke. This is more likely to develop in people who have high blood pressure readings or are overweight. ?Have your blood pressure checked: ?Every 3-5 years if you are 58-60 years of age. ?Every year if you are 58 years old or older. ?Diabetes ?Have regular diabetes screenings. This checks your fasting blood sugar level. Have the screening done: ?Once every three years after age 58 if you are at a normal weight and have a low risk for diabetes. ?More often and at a younger age if you are overweight or have a high risk for diabetes. ?What should I know about preventing infection? ?Hepatitis B ?If you have a higher risk for hepatitis B, you should be screened for this virus. Talk with your health care provider to find out if you are at risk for hepatitis B infection. ?Hepatitis C ?Testing is recommended for: ?Everyone born from [birth year] through [birth year]. ?Anyone with known risk factors for  hepatitis C. ?Sexually transmitted infections (STIs) ?Get screened for STIs, including gonorrhea and chlamydia, if: ?You are sexually active and are younger than 58 years of age. ?You are older than 58 years of age and your health care provider tells you that you are at risk for this type of infection. ?Your sexual activity has changed since you were last screened, and you are at  increased risk for chlamydia or gonorrhea. Ask your health care provider if you are at risk. ?Ask your health care provider about whether you are at high risk for HIV. Your health care provider may recommend a prescription medicine to help prevent HIV infection. If you choose to take medicine to prevent HIV, you should first get tested for HIV. You should then be tested every 3 months for as long as you are taking the medicine. ?Pregnancy ?If you are about to stop having your period (premenopausal) and you may become pregnant, seek counseling before you get pregnant. ?Take 400 to 800 micrograms (mcg) of folic acid every day if you become pregnant. ?Ask for birth control (contraception) if you want to prevent pregnancy. ?Osteoporosis and menopause ?Osteoporosis is a disease in which the bones lose minerals and strength with aging. This can result in bone fractures. If you are 58 years old or older, or if you are at risk for osteoporosis and fractures, ask your health care provider if you should: ?Be screened for bone loss. ?Take a calcium or vitamin D supplement to lower your risk of fractures. ?Be given hormone replacement therapy (HRT) to treat symptoms of menopause. ?Follow these instructions at home: ?Alcohol use ?Do not drink alcohol if: ?Your health care provider tells you not to drink. ?You are pregnant, may be pregnant, or are planning to become pregnant. ?If you drink alcohol: ?Limit how much you have to: ?0-1 drink a day. ?Know how much alcohol is in your drink. In the [place] one drink equals one 12 oz bottle of beer (355 mL), one 5 oz glass of wine (148 mL), or one 1? oz glass of hard liquor (44 mL). ?Lifestyle ?Do not use any products that contain nicotine or tobacco. These products include cigarettes, chewing tobacco, and vaping devices, such as e-cigarettes. If you need help quitting, ask your health care provider. ?Do not use street drugs. ?Do not share needles. ?Ask your health care provider for help  if you need support or information about quitting drugs. ?General instructions ?Schedule regular health, dental, and eye exams. ?Stay current with your vaccines. ?Tell your health care provider if: ?You often feel depressed. ?You have ever been abused or do not feel safe at home. ?Summary ?Adopting a healthy lifestyle and getting preventive care are important in promoting health and wellness. ?Follow your health care provider's instructions about healthy diet, exercising, and getting tested or screened for diseases. ?Follow your health care provider's instructions on monitoring your cholesterol and blood pressure. ?This information is not intended to replace advice given to you by your health care provider. Make sure you discuss any questions you have with your health care provider. ?Document Revised: 12/09/2020 Document Reviewed: 12/09/2020 ?Elsevier Patient Education ? [place]. ? ? ? ?

## 2021-11-21 NOTE — Progress Notes (Signed)
? ?This visit occurred during the SARS-CoV-2 public health emergency.  Safety protocols were in place, including screening questions prior to the visit, additional usage of staff PPE, and extensive cleaning of exam room while observing appropriate contact time as indicated for disinfecting solutions.  ? ? ?Patient ID: Sarah Krause, female  DOB: May 21, 1966, 56 y.o.   MRN: [id number] ?Patient Care Team  ?  Relationship Specialty Notifications Start End  ?Ma Hillock, DO PCP - General Family Medicine  08/14/19   ?Kerney Elbe, MD  Obstetrics and Gynecology  08/17/19   ?Phylliss Bob, MD Consulting Physician Orthopedic Surgery  08/17/19   ?Manon Hilding, MD Referring Physician Vascular Surgery  08/17/19   ?Evelina Bucy, DPM Consulting Physician Podiatry  08/17/19   ? ? ?Chief Complaint  ?Patient presents with  ? Annual Exam  ?  Pt is fasting  ? ? ?Subjective: ?Sarah Krause is a 56 y.o.  Female  present for CPE/CMC ?All past medical history, surgical history, allergies, family history, immunizations, medications and social history were updated in the electronic medical record today. ?All recent labs, ED visits and hospitalizations within the last year were reviewed. ? ?Health maintenance:  ?Colonoscopy: declined ?Mammogram: declined,  has GYN.  ?Cervical cancer screening: hysterectomy- has gyn Sarah Krause) ?Immunizations: tdap declined, Influenza UTD 2022 (encouraged yearly), covid series completed , shingrix declined ?Infectious disease screening:Hep C agreed to testing today ?DEXA: routine screen ?Assistive device: none ?Oxygen IRW:ERXV ?Patient has a Dental home. ?Hospitalizations/ED visits: reviewed ? ? ?  05/27/2021  ? 11:30 AM 09/17/2020  ?  2:03 PM 08/14/2019  ? 10:15 AM  ?Depression screen PHQ 2/9  ?Decreased Interest 0 0 0  ?Down, Depressed, Hopeless 0 0 0  ?PHQ - 2 Score 0 0 0  ? ?   ? View : No data to display.  ?  ?  ?  ? ? ?Immunization History  ?Administered Date(s) Administered  ?  Influenza,inj,Quad PF,6+ Mos 04/04/2019  ? Influenza-Unspecified 06/03/2014, 06/01/2020, 05/03/2021  ? Janssen (J&J) SARS-COV-2 Vaccination 11/11/2019  ? MMR 01/01/1969  ? Tdap 09/13/2006  ? ? ?Past Medical History:  ?Diagnosis Date  ? Arthritis   ? COVID-19 08/2021  ? Diverticulitis   ? Dry eyes   ? Family history of adverse reaction to anesthesia   ? mother hard time coming out of anesthia-("she's tiny"  ? Hemorrhoids   ? Herniation of intervertebral disc between L5 and S1 12/02/2017  ? Right L5-S1 large disc extrusion on the right with compression of the right side of the thecal sac and the right S1 nerve root.  ? History of kidney stones   ? passed  ? Migraines   ? Precancerous skin lesion   ? Abdomen and arm.  ? Symptomatic varicose veins, right 2016  ? Patient had x2 surgery on varicosities  ? ?Allergies  ?Allergen Reactions  ? Almond Oil Anaphylaxis and Hives  ? Other Swelling  ?   ?ALMONDS  ? Sulfa Antibiotics Hives  ? ?Past Surgical History:  ?Procedure Laterality Date  ? ABDOMINAL HYSTERECTOMY  2006  ? Partial hysterectomy with bladder tact  ? CHOLECYSTECTOMY  2004  ? COLONOSCOPY    ? has had 2  as 6//5/19  ? FOOT SURGERY Right 01/2021  ? image CT abd/pelvis  09/07/2014  ? Rectosigmoid colonic wall thickening with adjacent fat stranding compatible with diverticulitis.  Mild common duct dilatation in the setting of prior cholecystectomy, felt to be incidental and likely at the upper  end of normal range considering no right upper quadrant pain at the time.  ? Image MRI:  12/02/2017  ? L 2-3 mild facet joint arthritis.  L3-4 mild facet joint arthritis.  L4-5 mild facet joint arthritis.  L5-S1 large disc extrusion on the right with compression of the right side of the thecal sac and the right S1 nerve.  ? Image: ABI BLE  10/14/2020  ? Normal studies bilateral lower extremity; no signs of arterial disease  ? image: DG right knee  04/19/2011  ? Mild patellofemoral and medial compartment osteoarthritis. Tiny  cortical buckle in the posterior lateral tibial plateau-normal variant.  ? image: RLE Doppler  09/2020  ? Negative for DVT right lower extremity  ? image:DG right hip xray  11/09/2017  ? No acute fracture or malalignment.  Mild bilateral hip degenerative changes.  Mild degenerative disc and facet disease of the lower lumbar spine.  ? INCONTINENCE SURGERY  2016  ? Bladder mesh  ? LUMBAR LAMINECTOMY/DECOMPRESSION MICRODISCECTOMY Right 01/05/2018  ? Procedure: RIGHT SIDED LUMBAR FIVE - SACRUM ONE MICRODISECTOMY;  Surgeon: Phylliss Bob, MD;  Location: [place];  Service: Orthopedics;  Laterality: Right;  ? TONSILLECTOMY  1971  ? Varicose veins Right 2016  ? March & June 2016.  ? ?Family History  ?Problem Relation Age of Onset  ? Arthritis Mother   ? Colitis Mother   ? Macular degeneration Mother   ? Heart disease Father   ? Heart attack Father   ?     died 59  ? Diabetes Brother   ? Arthritis Brother   ? Hyperlipidemia Brother   ? Macular degeneration Brother   ? Ulcerative colitis Sister   ? Parkinson's disease Maternal Grandfather   ? Parkinson's disease Brother   ? ?Social History  ? ?Social History Narrative  ? Marital status/children/pets: Married.  2 children.  ? 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  ? Safety:   ?   -Wears a bicycle helmet riding a bike: Yes  ?   -smoke alarm in the home:Yes  ?   - wears seatbelt: Yes  ?   - Feels safe in their relationships: Yes  ? ? ?Allergies as of 11/21/2021   ? ?   Reactions  ? Almond Oil Anaphylaxis, Hives  ? Other Swelling  ? ALMONDS  ? Sulfa Antibiotics Hives  ? ?  ? ?  ?Medication List  ?  ? ?  ? Accurate as of November 21, 2021  3:44 PM. If you have any questions, ask your nurse or doctor.  ?  ?  ? ?  ? ?STOP taking these medications   ? ?benzonatate 100 MG capsule ?Commonly known as: TESSALON ?Stopped by: Howard Pouch, DO ?  ?doxycycline 100 MG tablet ?Commonly known as: VIBRA-TABS ?Stopped by: Howard Pouch, DO ?  ?ipratropium 0.03 % nasal  spray ?Commonly known as: ATROVENT ?Stopped by: Howard Pouch, DO ?  ? ?  ? ?TAKE these medications   ? ?Diclofenac Sodium CR 100 MG 24 hr tablet ?Take 1 tablet (100 mg total) by mouth daily. ?  ?OSTEO BI-FLEX/5-LOXIN ADVANCED PO ?Take by mouth. ?  ? ?  ? ? ?All past medical history, surgical history, allergies, family history, immunizations andmedications were updated in the EMR today and reviewed under the history and medication portions of their EMR.    ? ?No results found for this or any previous visit (from the past 2160 hour(s)). ? ?DG Foot Complete Right ? ?Result  Date: 10/29/2020 ?CLINICAL DATA:  Metatarsal pain. Right foot pain at the ball of the foot x6 weeks. EXAM: RIGHT FOOT COMPLETE - 3+ VIEW COMPARISON:  None. FINDINGS: There is no evidence of fracture or dislocation. There is no evidence of arthropathy or other focal bone abnormality. Soft tissues are unremarkable. IMPRESSION: Negative. Electronically Signed   By: Constance Holster M.D.   On: 10/29/2020 13:40  ? ? ?ROS ?14 pt review of systems performed and negative (unless mentioned in an HPI) ? ?Objective: ?BP 122/82   Pulse 63   Temp 98.1 ?F (36.7 ?C) (Oral)   Ht 5' 3.5" (1.613 m)   Wt 209 lb (94.8 kg)   SpO2 98%   BMI 36.44 kg/m?  ?Physical Exam ?Vitals and nursing note reviewed.  ?Constitutional:   ?   General: She is not in acute distress. ?   Appearance: Normal appearance. She is obese. She is not ill-appearing or toxic-appearing.  ?HENT:  ?   Head: Normocephalic and atraumatic.  ?   Right Ear: Tympanic membrane, ear canal and external ear normal. There is no impacted cerumen.  ?   Left Ear: Tympanic membrane, ear canal and external ear normal. There is no impacted cerumen.  ?   Nose: No congestion or rhinorrhea.  ?   Mouth/Throat:  ?   Mouth: Mucous membranes are moist.  ?   Pharynx: Oropharynx is clear. No oropharyngeal exudate or posterior oropharyngeal erythema.  ?Eyes:  ?   General: No scleral icterus.    ?   Right eye: No discharge.      ?   Left eye: No discharge.  ?   Extraocular Movements: Extraocular movements intact.  ?   Conjunctiva/sclera: Conjunctivae normal.  ?   Pupils: Pupils are equal, round, and reactive to light.  ?Cardiovascu

## 2021-11-24 ENCOUNTER — Telehealth: Payer: Self-pay | Admitting: Family Medicine

## 2021-11-24 DIAGNOSIS — R7401 Elevation of levels of liver transaminase levels: Secondary | ICD-10-CM

## 2021-11-24 NOTE — Telephone Encounter (Signed)
Please call patient ?kidney and thyroid function are normal ?One of her liver enzymes is just mildly elevated at 47, normal is less than 35.  This is not typically considered worrisome at this level.  It is most likely from diclofenac use. ?  -She can continue diclofenac if desired for her arthritic discomfort, this mild elevation is not high enough to encourage her to stop the medication.   ?  -I would recommend retesting by lab appointment only in 4 weeks, to ensure it is not increasing. ?Blood cell counts and electrolytes are normal.  Her WBC is 3.5, although this is flagged abnormal, this is a normal variant and not concerning considering all the remainder of her blood cell counts are normal. ?Diabetes screening/A1c is normal  ?Cholesterol panel looks is at goal for her with an LDL less than 135. ? ?Hep C screen is pending we will call her if results are abnormal. ? ?

## 2021-11-24 NOTE — Telephone Encounter (Signed)
Spoke with pt regarding labs and instructions.   

## 2021-11-26 LAB — HEPATITIS C ANTIBODY
Hepatitis C Ab: NONREACTIVE
SIGNAL TO CUT-OFF: 0.04 (ref <1.00)

## 2021-11-27 LAB — HEPATITIS C ANTIBODY
Hepatitis C Ab: NONREACTIVE
SIGNAL TO CUT-OFF: 0.14 (ref <1.00)

## 2022-01-06 ENCOUNTER — Telehealth: Payer: BC Managed Care – PPO | Admitting: Physician Assistant

## 2022-01-06 DIAGNOSIS — J019 Acute sinusitis, unspecified: Secondary | ICD-10-CM | POA: Diagnosis not present

## 2022-01-06 DIAGNOSIS — B9789 Other viral agents as the cause of diseases classified elsewhere: Secondary | ICD-10-CM

## 2022-01-07 MED ORDER — IPRATROPIUM BROMIDE 0.03 % NA SOLN: 2.0000 | Freq: Two times a day (BID) | NASAL | 0 refills | Status: DC

## 2022-01-07 MED ORDER — DOXYCYCLINE HYCLATE 100 MG PO TABS: 100.0000 mg | ORAL_TABLET | Freq: Two times a day (BID) | ORAL | 0 refills | Status: DC

## 2022-01-07 NOTE — Addendum Note (Signed)
Addended by: Brunetta Jeans on: 01/07/2022 07:05 AM   Modules accepted: Orders

## 2022-01-07 NOTE — Progress Notes (Signed)
E-Visit for Sinus Problems  We are sorry that you are not feeling well.  Here is how we plan to help!  I do recommend a COVID test at home for this illness, just to be cautious.   Based on what you have shared with me it looks like you have sinusitis.  Sinusitis is inflammation and infection in the sinus cavities of the head.  Based on your presentation I believe you most likely have Acute Viral Sinusitis.This is an infection most likely caused by a virus. There is not specific treatment for viral sinusitis other than to help you with the symptoms until the infection runs its course.  You may use an oral decongestant such as Mucinex D or if you have glaucoma or high blood pressure use plain Mucinex. Saline nasal spray help and can safely be used as often as needed for congestion, I have prescribed: Ipratropium Bromide nasal spray 0.03% 2 sprays in eah nostril 2-3 times a day  Some authorities believe that zinc sprays or the use of Echinacea may shorten the course of your symptoms.  Sinus infections are not as easily transmitted as other respiratory infection, however we still recommend that you avoid close contact with loved ones, especially the very young and elderly.  Remember to wash your hands thoroughly throughout the day as this is the number one way to prevent the spread of infection!  Home Care: Only take medications as instructed by your medical team. Do not take these medications with alcohol. A steam or ultrasonic humidifier can help congestion.  You can place a towel over your head and breathe in the steam from hot water coming from a faucet. Avoid close contacts especially the very young and the elderly. Cover your mouth when you cough or sneeze. Always remember to wash your hands.  Get Help Right Away If: You develop worsening fever or sinus pain. You develop a severe head ache or visual changes. Your symptoms persist after you have completed your treatment plan.  Make sure  you Understand these instructions. Will watch your condition. Will get help right away if you are not doing well or get worse.   Thank you for choosing an e-visit.  Your e-visit answers were reviewed by a board certified advanced clinical practitioner to complete your personal care plan. Depending upon the condition, your plan could have included both over the counter or prescription medications.  Please review your pharmacy choice. Make sure the pharmacy is open so you can pick up prescription now. If there is a problem, you may contact your provider through [employer] and have the prescription routed to another pharmacy.  Your safety is important to [place]. If you have drug allergies check your prescription carefully.   For the next 24 hours you can use MyChart to ask questions about today's visit, request a non-urgent call back, or ask for a work or school excuse. You will get an email in the next two days asking about your experience. I hope that your e-visit has been valuable and will speed your recovery.

## 2022-01-07 NOTE — Progress Notes (Signed)
I have spent 5 minutes in review of e-visit questionnaire, review and updating patient chart, medical decision making and response to patient.   Shaheed Schmuck Cody Tukker Byrns, PA-C    

## 2022-07-29 ENCOUNTER — Telehealth: Payer: Self-pay | Admitting: Physician Assistant

## 2022-07-29 DIAGNOSIS — J0110 Acute frontal sinusitis, unspecified: Secondary | ICD-10-CM

## 2022-07-29 MED ORDER — AMOXICILLIN-POT CLAVULANATE 875-125 MG PO TABS: 1.0000 | ORAL_TABLET | Freq: Two times a day (BID) | ORAL | 0 refills | Status: DC

## 2022-07-29 NOTE — Progress Notes (Signed)

## 2022-07-29 NOTE — Progress Notes (Signed)
I have spent 5 minutes in review of e-visit questionnaire, review and updating patient chart, medical decision making and response to patient.   Desirae Mancusi Cody Ezra Denne, PA-C    

## 2022-08-03 ENCOUNTER — Telehealth: Admitting: Physician Assistant

## 2022-08-03 DIAGNOSIS — R051 Acute cough: Secondary | ICD-10-CM

## 2022-08-03 MED ORDER — PREDNISONE 20 MG PO TABS
40.0000 mg | ORAL_TABLET | Freq: Every day | ORAL | 0 refills | Status: DC
Start: 2022-08-03 — End: 2023-02-10

## 2022-08-03 MED ORDER — BENZONATATE 100 MG PO CAPS: 100.0000 mg | ORAL_CAPSULE | Freq: Three times a day (TID) | ORAL | 0 refills | Status: DC | PRN

## 2022-08-03 NOTE — Progress Notes (Signed)
We are sorry that you are not feeling well.  Here is how we plan to help!  Based on your presentation I believe you most likely have A cough due to a virus.  This is called viral bronchitis and is best treated by rest, plenty of fluids and control of the cough.  You may use Ibuprofen or Tylenol as directed to help your symptoms.     In addition you may use A non-prescription cough medication called Mucinex DM: take 2 tablets every 12 hours. and A prescription cough medication called Tessalon Perles '100mg'$ . You may take 1-2 capsules every 8 hours as needed for your cough.  Prednisone '20mg'$  Take '40mg'$  (2 tablets) daily for 5 days.   Continue Augmentin  From your responses in the eVisit questionnaire you describe inflammation in the upper respiratory tract which is causing a significant cough.  This is commonly called Bronchitis and has four common causes:   Allergies Viral Infections Acid Reflux Bacterial Infection Allergies, viruses and acid reflux are treated by controlling symptoms or eliminating the cause. An example might be a cough caused by taking certain blood pressure medications. You stop the cough by changing the medication. Another example might be a cough caused by acid reflux. Controlling the reflux helps control the cough.  USE OF BRONCHODILATOR ("RESCUE") INHALERS: There is a risk from using your bronchodilator too frequently.  The risk is that over-reliance on a medication which only relaxes the muscles surrounding the breathing tubes can reduce the effectiveness of medications prescribed to reduce swelling and congestion of the tubes themselves.  Although you feel brief relief from the bronchodilator inhaler, your asthma may actually be worsening with the tubes becoming more swollen and filled with mucus.  This can delay other crucial treatments, such as oral steroid medications. If you need to use a bronchodilator inhaler daily, several times per day, you should discuss this with your  provider.  There are probably better treatments that could be used to keep your asthma under control.     HOME CARE Only take medications as instructed by your medical team. Complete the entire course of an antibiotic. Drink plenty of fluids and get plenty of rest. Avoid close contacts especially the very young and the elderly Cover your mouth if you cough or cough into your sleeve. Always remember to wash your hands A steam or ultrasonic humidifier can help congestion.   GET HELP RIGHT AWAY IF: You develop worsening fever. You become short of breath You cough up blood. Your symptoms persist after you have completed your treatment plan MAKE SURE YOU  Understand these instructions. Will watch your condition. Will get help right away if you are not doing well or get worse.    Thank you for choosing an e-visit.  Your e-visit answers were reviewed by a board certified advanced clinical practitioner to complete your personal care plan. Depending upon the condition, your plan could have included both over the counter or prescription medications.  Please review your pharmacy choice. Make sure the pharmacy is open so you can pick up prescription now. If there is a problem, you may contact your provider through [employer] and have the prescription routed to another pharmacy.  Your safety is important to [place]. If you have drug allergies check your prescription carefully.   For the next 24 hours you can use MyChart to ask questions about today's visit, request a non-urgent call back, or ask for a work or school excuse. You will get an  email in the next two days asking about your experience. I hope that your e-visit has been valuable and will speed your recovery.  I have spent 5 minutes in review of e-visit questionnaire, review and updating patient chart, medical decision making and response to patient.   Mar Daring, PA-C

## 2023-02-10 ENCOUNTER — Encounter: Payer: Self-pay | Admitting: Family Medicine

## 2023-02-10 ENCOUNTER — Ambulatory Visit (INDEPENDENT_AMBULATORY_CARE_PROVIDER_SITE_OTHER): Payer: Managed Care, Other (non HMO) | Admitting: Family Medicine

## 2023-02-10 VITALS — BP 134/86 | HR 66 | Temp 97.5°F | Ht 63.5 in | Wt 178.0 lb

## 2023-02-10 DIAGNOSIS — Z5320 Procedure and treatment not carried out because of patient's decision for unspecified reasons: Secondary | ICD-10-CM | POA: Diagnosis not present

## 2023-02-10 DIAGNOSIS — Z0001 Encounter for general adult medical examination with abnormal findings: Secondary | ICD-10-CM

## 2023-02-10 DIAGNOSIS — Z2821 Immunization not carried out because of patient refusal: Secondary | ICD-10-CM | POA: Diagnosis not present

## 2023-02-10 DIAGNOSIS — R7401 Elevation of levels of liver transaminase levels: Secondary | ICD-10-CM

## 2023-02-10 DIAGNOSIS — Z13220 Encounter for screening for lipoid disorders: Secondary | ICD-10-CM

## 2023-02-10 DIAGNOSIS — K644 Residual hemorrhoidal skin tags: Secondary | ICD-10-CM

## 2023-02-10 DIAGNOSIS — Z131 Encounter for screening for diabetes mellitus: Secondary | ICD-10-CM

## 2023-02-10 DIAGNOSIS — Z0000 Encounter for general adult medical examination without abnormal findings: Secondary | ICD-10-CM

## 2023-02-10 DIAGNOSIS — Z1211 Encounter for screening for malignant neoplasm of colon: Secondary | ICD-10-CM

## 2023-02-10 LAB — CBC WITH DIFFERENTIAL/PLATELET
Basophils Absolute: 0.1 10*3/uL (ref 0.0–0.1)
Basophils Relative: 1.8 % (ref 0.0–3.0)
Eosinophils Absolute: 0.1 10*3/uL (ref 0.0–0.7)
Eosinophils Relative: 3.1 % (ref 0.0–5.0)
HCT: 43.2 % (ref 36.0–46.0)
Hemoglobin: 14.2 g/dL (ref 12.0–15.0)
Lymphocytes Relative: 46.6 % — ABNORMAL HIGH (ref 12.0–46.0)
Lymphs Abs: 1.7 10*3/uL (ref 0.7–4.0)
MCHC: 32.9 g/dL (ref 30.0–36.0)
MCV: 88.1 fl (ref 78.0–100.0)
Monocytes Absolute: 0.5 10*3/uL (ref 0.1–1.0)
Monocytes Relative: 12.3 % — ABNORMAL HIGH (ref 3.0–12.0)
Neutro Abs: 1.4 10*3/uL (ref 1.4–7.7)
Neutrophils Relative %: 36.2 % — ABNORMAL LOW (ref 43.0–77.0)
Platelets: 206 10*3/uL (ref 150.0–400.0)
RBC: 4.91 Mil/uL (ref 3.87–5.11)
RDW: 14.3 % (ref 11.5–15.5)
WBC: 3.8 10*3/uL — ABNORMAL LOW (ref 4.0–10.5)

## 2023-02-10 LAB — COMPREHENSIVE METABOLIC PANEL
ALT: 20 U/L (ref 0–35)
AST: 16 U/L (ref 0–37)
Albumin: 4.2 g/dL (ref 3.5–5.2)
Alkaline Phosphatase: 77 U/L (ref 39–117)
BUN: 19 mg/dL (ref 6–23)
CO2: 27 mEq/L (ref 19–32)
Calcium: 9.4 mg/dL (ref 8.4–10.5)
Chloride: 104 mEq/L (ref 96–112)
Creatinine, Ser: 0.65 mg/dL (ref 0.40–1.20)
GFR: 96.44 mL/min (ref >60.00)
Glucose, Bld: 89 mg/dL (ref 70–99)
Potassium: 3.9 mEq/L (ref 3.5–5.1)
Sodium: 140 mEq/L (ref 135–145)
Total Bilirubin: 0.6 mg/dL (ref 0.2–1.2)
Total Protein: 6.7 g/dL (ref 6.0–8.3)

## 2023-02-10 LAB — HEMOGLOBIN A1C: Hgb A1c MFr Bld: 5.4 % (ref 4.6–6.5)

## 2023-02-10 LAB — LIPID PANEL
Cholesterol: 247 mg/dL — ABNORMAL HIGH (ref 0–200)
HDL: 57.6 mg/dL (ref >39.00)
LDL Cholesterol: 170 mg/dL — ABNORMAL HIGH (ref 0–99)
NonHDL: 189.2
Total CHOL/HDL Ratio: 4
Triglycerides: 96 mg/dL (ref 0.0–149.0)
VLDL: 19.2 mg/dL (ref 0.0–40.0)

## 2023-02-10 LAB — TSH: TSH: 1.42 u[IU]/mL (ref 0.35–5.50)

## 2023-02-10 NOTE — Progress Notes (Signed)
Patient ID: Sarah Krause, female  DOB: 1966-02-29, 57 y.o.   MRN: [id number] Patient Care Team    Relationship Specialty Notifications Start End  Natalia Leatherwood, DO PCP - General Family Medicine  08/14/19   Ricka Burdock, MD  Obstetrics and Gynecology  08/17/19   Estill Bamberg, MD Consulting Physician Orthopedic Surgery  08/17/19   Doretha Imus, MD Referring Physician Vascular Surgery  08/17/19   Park Liter, DPM (Inactive) Consulting Physician Podiatry  08/17/19     Chief Complaint  Patient presents with   Annual Exam    Pt is fasting    Subjective: Jacquelyne Quarry is a 57 y.o.  Female  present for annual physical All past medical history, surgical history, allergies, family history, immunizations, medications and social history were updated in the electronic medical record today. All recent labs, ED visits and hospitalizations within the last year were reviewed.  Health maintenance:  Colon cancer screen: declined colonoscopy. Agreeable to Cologuard. Mammogram: declined,  has GYN. 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 Hospitalizations/ED visits: Reviewed      02/10/2023    8:56 AM 05/27/2021   11:30 AM 09/17/2020    2:03 PM 08/14/2019   10:15 AM  Depression screen PHQ 2/9  Decreased Interest 0 0 0 0  Down, Depressed, Hopeless 0 0 0 0  PHQ - 2 Score 0 0 0 0      02/10/2023    8:56 AM  GAD 7 : Generalized Anxiety Score  Nervous, Anxious, on Edge 0  Control/stop worrying 0  Worry too much - different things 0  Trouble relaxing 0  Restless 0  Easily annoyed or irritable 0  Afraid - awful might happen 0  Total GAD 7 Score 0    Immunization History  Administered Date(s) Administered    Influenza,inj,Quad PF,6+ Mos 04/04/2019   Influenza-Unspecified 06/03/2014, 06/01/2020, 05/03/2021   Janssen (J&J) SARS-COV-2 Vaccination 11/11/2019   MMR 01/01/1969   Tdap 09/13/2006    Past Medical History:  Diagnosis Date   Arthritis    COVID-19 08/2021   Diverticulitis    Dry eyes    Family history of adverse reaction to anesthesia    mother hard time coming out of anesthia-("she's tiny"   Hemorrhoids    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   History of kidney stones    passed   Migraines    Precancerous skin lesion    Abdomen and arm.   Symptomatic varicose veins, right 2016   Patient had x2 surgery on varicosities   Allergies  Allergen Reactions   Almond Oil Anaphylaxis and Hives   Other Swelling     ALMONDS   Sulfa Antibiotics Hives   Past Surgical History:  Procedure Laterality Date   ABDOMINAL HYSTERECTOMY  2006   Partial hysterectomy with bladder tact   CHOLECYSTECTOMY  2004   COLONOSCOPY     has had 2  as 6//5/19   FOOT SURGERY Right 01/2021   image CT abd/pelvis  09/07/2014   Rectosigmoid colonic wall thickening with adjacent fat stranding compatible with diverticulitis.  Mild common duct dilatation in the setting  of prior cholecystectomy, felt to be incidental and likely at the upper end of normal range considering no right upper quadrant pain at the time.   Image MRI:  12/02/2017   L 2-3 mild facet joint arthritis.  L3-4 mild facet joint arthritis.  L4-5 mild facet joint arthritis.  L5-S1 large disc extrusion on the right with compression of the right side of the thecal sac and the right S1 nerve.   Image: ABI BLE  10/14/2020   Normal studies bilateral lower extremity; no signs of arterial disease   image: DG right knee  04/19/2011   Mild patellofemoral and medial compartment osteoarthritis. Tiny  cortical buckle in the posterior lateral tibial plateau-normal variant.   image: RLE Doppler  09/2020   Negative for DVT right lower extremity   image:DG right hip xray  11/09/2017   No acute fracture or malalignment.  Mild bilateral hip degenerative changes.  Mild degenerative disc and facet disease of the lower lumbar spine.   INCONTINENCE SURGERY  2016   Bladder mesh   LUMBAR LAMINECTOMY/DECOMPRESSION MICRODISCECTOMY Right 01/05/2018   Procedure: RIGHT SIDED LUMBAR FIVE - SACRUM ONE MICRODISECTOMY;  Surgeon: Estill Bamberg, MD;  Location: [facility] OR;  Service: Orthopedics;  Laterality: Right;   TONSILLECTOMY  1971   Varicose veins Right 2016   March & June 2016.   Family History  Problem Relation Age of Onset   Arthritis Mother    Colitis Mother    Macular degeneration Mother    Heart disease Father    Heart attack Father        died 66   Diabetes Brother    Arthritis Brother    Hyperlipidemia Brother    Macular degeneration Brother    Ulcerative colitis Sister    Parkinson's disease Maternal Grandfather    Parkinson's disease Brother    Social History   Social History Narrative   Marital status/children/pets: Married.  2 children.   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   Safety:      -Wears a bicycle helmet riding a bike: Yes     -smoke alarm in the home:Yes     - wears seatbelt: Yes     - Feels safe in their relationships: Yes    Allergies as of 02/10/2023       Reactions   Almond Oil Anaphylaxis, Hives   Other Swelling   ALMONDS   Sulfa Antibiotics Hives        Medication List        Accurate as of February 10, 2023  3:37 PM. If you have any questions, ask your nurse or doctor.          STOP taking these medications    amoxicillin-clavulanate 875-125 MG tablet Commonly known as: AUGMENTIN Stopped by: Felix Pacini, DO   benzonatate 100 MG capsule Commonly known as: TESSALON Stopped by: Felix Pacini, DO   Diclofenac  Sodium CR 100 MG 24 hr tablet Stopped by: Felix Pacini, DO   ipratropium 0.03 % nasal spray Commonly known as: ATROVENT Stopped by: Felix Pacini, DO   OSTEO BI-FLEX/5-LOXIN ADVANCED PO Stopped by: Felix Pacini, DO   predniSONE 20 MG tablet Commonly known as: DELTASONE Stopped by: Felix Pacini, DO       TAKE these medications    B-12 2500 MCG Subl Place under the tongue.   CALCIUM-MAGNESIUM-ZINC-D3 PO Take by mouth.   HAIR SKIN NAILS GUMMIES PO Take by mouth.   multivitamin tablet Take 1 tablet by  mouth daily.   OSTEO BI-FLEX REGULAR STRENGTH PO Take by mouth.        All past medical history, surgical history, allergies, family history, immunizations andmedications were updated in the EMR today and reviewed under the history and medication portions of their EMR.     No results found for this or any previous visit (from the past 2160 hour(s)).   ROS 14 pt review of systems performed and negative (unless mentioned in an HPI)  Objective: BP 134/86   Pulse 66   Temp (!) 97.5 F (36.4 C)   Ht 5' 3.5" (1.613 m)   Wt 178 lb (80.7 kg)   SpO2 97%   BMI 31.04 kg/m  Physical Exam Vitals and nursing note reviewed.  Constitutional:      General: She is not in acute distress.    Appearance: Normal appearance. She is obese. She is not ill-appearing or toxic-appearing.  HENT:     Head: Normocephalic and atraumatic.     Right Ear: Tympanic membrane, ear canal and external ear normal. There is no impacted cerumen.     Left Ear: Tympanic membrane, ear canal and external ear normal. There is no impacted cerumen.     Nose: No congestion or rhinorrhea.     Mouth/Throat:     Mouth: Mucous membranes are moist.     Pharynx: Oropharynx is clear. No oropharyngeal exudate or posterior oropharyngeal erythema.  Eyes:     General: No scleral icterus.       Right eye: No discharge.        Left eye: No discharge.     Extraocular Movements: Extraocular movements intact.      Conjunctiva/sclera: Conjunctivae normal.     Pupils: Pupils are equal, round, and reactive to light.  Cardiovascular:     Rate and Rhythm: Normal rate and regular rhythm.     Pulses: Normal pulses.     Heart sounds: Normal heart sounds. No murmur heard.    No friction rub. No gallop.  Pulmonary:     Effort: Pulmonary effort is normal. No respiratory distress.     Breath sounds: Normal breath sounds. No stridor. No wheezing, rhonchi or rales.  Chest:     Chest wall: No tenderness.  Abdominal:     General: Abdomen is flat. Bowel sounds are normal. There is no distension.     Palpations: Abdomen is soft. There is no mass.     Tenderness: There is no abdominal tenderness. There is no right CVA tenderness, left CVA tenderness, guarding or rebound.     Hernia: No hernia is present.  Musculoskeletal:        General: No swelling, tenderness or deformity. Normal range of motion.     Cervical back: Normal range of motion and neck supple. No rigidity or tenderness.     Right lower leg: No edema.     Left lower leg: No edema.  Lymphadenopathy:     Cervical: No cervical adenopathy.  Skin:    General: Skin is warm and dry.     Coloration: Skin is not jaundiced or pale.     Findings: No bruising, erythema, lesion or rash.  Neurological:     General: No focal deficit present.     Mental Status: She is alert and oriented to person, place, and time. Mental status is at baseline.     Cranial Nerves: No cranial nerve deficit.     Sensory: No sensory deficit.     Motor: No weakness.  Coordination: Coordination normal.     Gait: Gait normal.     Deep Tendon Reflexes: Reflexes normal.  Psychiatric:        Mood and Affect: Mood normal.        Behavior: Behavior normal.        Thought Content: Thought content normal.        Judgment: Judgment normal.     No results found.  Assessment/plan: Harlym Gehling is a 57 y.o. female present for annual physical Routine general medical examination  at a health care facility Patient was encouraged to exercise greater than 150 minutes a week. Patient was encouraged to choose a diet filled with fresh fruits and vegetables, and lean meats. AVS provided to patient today for education/recommendation on gender specific health and safety maintenance. Colon cancer screen: declined colonoscopy. Agreeable to Cologuard. Mammogram: declined,  has GYN. 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 Vaccination not carried out because of patient refusal Tdap and Shingrix declined Mammogram declined Elevated ALT measurement - Comprehensive metabolic panel Diabetes mellitus screening - Hemoglobin A1c Lipid screening - Lipid panel External hemorrhoids Pt desires referral to discuss tx  - Ambulatory referral to Colorectal Surgery   Return in about 1 year (around 02/11/2024) for cpe (20 min).   Orders Placed This Encounter  Procedures   CBC with Differential/Platelet   Comprehensive metabolic panel   TSH   Cologuard   Hemoglobin A1c   Lipid panel   Ambulatory referral to Colorectal Surgery   No orders of the defined types were placed in this encounter.  Referral Orders         Ambulatory referral to Colorectal Surgery        Electronically signed by: Felix Pacini, DO [facility]

## 2023-02-10 NOTE — Patient Instructions (Addendum)
Return in about 1 year (around 02/11/2024) for cpe (20 min).        Great to see you today.  I have refilled the medication(s) we provide.   If labs were collected, we will inform you of lab results once received either by echart message or telephone call.   - echart message- for normal results that have been seen by the patient already.   - telephone call: abnormal results or if patient has not viewed results in their echart.  

## 2023-02-11 ENCOUNTER — Encounter: Payer: Self-pay | Admitting: Family Medicine

## 2023-02-11 DIAGNOSIS — E785 Hyperlipidemia, unspecified: Secondary | ICD-10-CM | POA: Insufficient documentation

## 2023-03-11 ENCOUNTER — Ambulatory Visit: Payer: Self-pay | Admitting: Surgery

## 2023-07-13 ENCOUNTER — Encounter: Payer: Self-pay | Admitting: Family Medicine

## 2023-07-13 ENCOUNTER — Ambulatory Visit (INDEPENDENT_AMBULATORY_CARE_PROVIDER_SITE_OTHER): Payer: Managed Care, Other (non HMO) | Admitting: Family Medicine

## 2023-07-13 VITALS — BP 124/84 | HR 68 | Temp 98.0°F | Wt 184.6 lb

## 2023-07-13 DIAGNOSIS — I83899 Varicose veins of unspecified lower extremities with other complications: Secondary | ICD-10-CM | POA: Diagnosis not present

## 2023-07-13 DIAGNOSIS — M25561 Pain in right knee: Secondary | ICD-10-CM | POA: Diagnosis not present

## 2023-07-13 NOTE — Patient Instructions (Addendum)
  Start baby aspirin (81 mg every day) for now, while we wait on doppler study.         Great to see you today.  I have refilled the medication(s) we provide.   If labs were collected or images ordered, we will inform you of  results once we have received them and reviewed. We will contact you either by echart message, or telephone call.  Please give ample time to the testing facility, and our office to run,  receive and review results. Please do not call inquiring of results, even if you can see them in your chart. We will contact you as soon as we are able. If it has been over 1 week since the test was completed, and you have not yet heard from [place], then please call us.    - echart message- for normal results that have been seen by the patient already.   - telephone call: abnormal results or if patient has not viewed results in their echart.  If a referral to a specialist was entered for you, please call us in 2 weeks if you have not heard from the specialist office to schedule.

## 2023-07-13 NOTE — Progress Notes (Signed)
Sarah Krause , [birth year], 57 y.o., female MRN: [id number] Patient Care Team    Relationship Specialty Notifications Start End  Natalia Leatherwood, DO PCP - General Family Medicine  08/14/19   Ricka Burdock, MD  Obstetrics and Gynecology  08/17/19   Estill Bamberg, MD Consulting Physician Orthopedic Surgery  08/17/19   Doretha Imus, MD Referring Physician Vascular Surgery  08/17/19   Park Liter, DPM (Inactive) Consulting Physician Podiatry  08/17/19     Chief Complaint  Patient presents with   Leg Pain    Right; 1.5 week denies any injury     Subjective: Sarah Krause is a 57 y.o. Pt presents for an OV with complaints of right posterior knee pain of 10 days duration.  Associated symptoms include "tight "discomfort posterior right knee to calf. Patient has history of varicose veins in her right leg and has had 2 surgeries by vascular and vein. She has significant arthritis of her right knee.  She does not recall any type of injury or twisting of her knee. She does believe the prominent looking vein in her thigh and over anterior portion of her shin, as well as the broken blood vessels present on her thigh did appear around the same time as the swelling of her posterior knee and discomfort. Patient denies fever, shortness of breath or erythema of the skin.  She does not have a history of blood clots. She denies any recent travel.  She is not taking hormones.     02/10/2023    8:56 AM 05/27/2021   11:30 AM 09/17/2020    2:03 PM 08/14/2019   10:15 AM  Depression screen PHQ 2/9  Decreased Interest 0 0 0 0  Down, Depressed, Hopeless 0 0 0 0  PHQ - 2 Score 0 0 0 0    Allergies  Allergen Reactions   Almond Oil Anaphylaxis and Hives   Other Swelling     ALMONDS   Sulfa Antibiotics Hives   Social History   Social History Narrative   Marital status/children/pets: Married.  2 children.   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   History of kidney stones    passed   Migraines    Precancerous skin lesion    Abdomen and arm.   Symptomatic varicose veins, right 2016   Patient had x2 surgery on varicosities   Past Surgical History:  Procedure Laterality Date   ABDOMINAL HYSTERECTOMY  2006   Partial hysterectomy with bladder tact   CHOLECYSTECTOMY  2004   COLONOSCOPY     has had 2  as 6//5/19   FOOT SURGERY Right 01/2021   image CT abd/pelvis  09/07/2014   Rectosigmoid  colonic wall thickening with adjacent fat stranding compatible with diverticulitis.  Mild common duct dilatation in the setting of prior cholecystectomy, felt to be incidental and likely at the upper end of normal range considering no right upper quadrant pain at the time.   Image MRI:  12/02/2017   L 2-3 mild facet joint arthritis.  L3-4 mild facet joint arthritis.  L4-5 mild facet joint arthritis.  L5-S1 large disc extrusion on the right with compression of the right side of the thecal sac and the right S1 nerve.   Image: ABI BLE  10/14/2020   Normal studies bilateral lower extremity; no signs of arterial disease   image: DG right knee  04/19/2011   Mild patellofemoral and medial compartment osteoarthritis. Tiny cortical buckle in the posterior lateral tibial plateau-normal variant.   image: RLE Doppler  09/2020   Negative for DVT right lower extremity    image:DG right hip xray  11/09/2017   No acute fracture or malalignment.  Mild bilateral hip degenerative changes.  Mild degenerative disc and facet disease of the lower lumbar spine.   INCONTINENCE SURGERY  2016   Bladder mesh   LUMBAR LAMINECTOMY/DECOMPRESSION MICRODISCECTOMY Right 01/05/2018   Procedure: RIGHT SIDED LUMBAR FIVE - SACRUM ONE MICRODISECTOMY;  Surgeon: Estill Bamberg, MD;  Location: [facility] OR;  Service: Orthopedics;  Laterality: Right;   TONSILLECTOMY  1971   Varicose veins Right 2016   March & June 2016.   Family History  Problem Relation Age of Onset   Arthritis Mother    Colitis Mother    Macular degeneration Mother    Heart disease Father    Heart attack Father        died 57   Diabetes Brother    Arthritis Brother    Hyperlipidemia Brother    Macular degeneration Brother    Ulcerative colitis Sister    Parkinson's disease Maternal Grandfather    Parkinson's disease Brother    Allergies as of 07/13/2023       Reactions   Almond Oil Anaphylaxis, Hives   Other Swelling   ALMONDS   Sulfa Antibiotics Hives        Medication List        Accurate as of July 13, 2023  1:52 PM. If you have any questions, ask your nurse or doctor.          B-12 2500 MCG Subl Place under the tongue.   CALCIUM-MAGNESIUM-ZINC-D3 PO Take by mouth.   HAIR SKIN NAILS GUMMIES PO Take by mouth.   multivitamin tablet Take 1 tablet by mouth daily.   OSTEO BI-FLEX REGULAR STRENGTH PO Take by mouth.        All past medical history, surgical history, allergies, family history, immunizations andmedications were updated in the EMR today and reviewed under the history and medication portions of their EMR.     ROS Negative, with the exception of above mentioned in HPI   Objective:  BP 124/84   Pulse 68   Temp 98 F (36.7 C)   Wt 184 lb 9.6 oz (83.7 kg)   SpO2 97%   BMI 32.19 kg/m  Body mass index is 32.19 kg/m. Physical Exam Vitals and nursing note  reviewed.  Constitutional:      General: She is not in acute distress.    Appearance: Normal appearance. She is normal weight. She is not ill-appearing or toxic-appearing.  HENT:     Head: Normocephalic and atraumatic.  Eyes:     General: No scleral  icterus.       Right eye: No discharge.        Left eye: No discharge.     Extraocular Movements: Extraocular movements intact.     Conjunctiva/sclera: Conjunctivae normal.     Pupils: Pupils are equal, round, and reactive to light.  Cardiovascular:     Rate and Rhythm: Normal rate and regular rhythm.  Pulmonary:     Effort: Pulmonary effort is normal.     Breath sounds: Normal breath sounds.  Musculoskeletal:        General: Normal range of motion.     Right lower leg: No tenderness.     Left lower leg: Normal. No edema.       Legs:     Comments: Mild Swelling posterior right knee-pink circle above X 2 small bruising/broken blood vessel areas-purple dots above X 2 prominent veins-green above No tenderness to palpation.  No erythema. Neurovascularly intact distally  Skin:    Findings: No rash.  Neurological:     Mental Status: She is alert and oriented to person, place, and time. Mental status is at baseline.     Motor: No weakness.     Coordination: Coordination normal.     Gait: Gait normal.  Psychiatric:        Mood and Affect: Mood normal.        Behavior: Behavior normal.        Thought Content: Thought content normal.        Judgment: Judgment normal.      No results found. No results found. No results found for this or any previous visit (from the past 24 hour(s)).  Assessment/Plan: Sarah Krause is a 57 y.o. female present for OV for  Acute pain of right knee/history of varicose veins of legs with complications We discussed differential diagnosis of Baker's cyst, versus DVT, versus varicosity. Will obtain stat Doppler of right lower extremity to rule out DVT Patient will start baby aspirin today - US  Venous Img Lower Unilateral Right (DVT); Future Follow-up dependent upon results  Reviewed expectations re: course of current medical issues. Discussed self-management of symptoms. Outlined signs and symptoms indicating need for more acute intervention. Patient verbalized understanding and all questions were answered. Patient received an After-Visit Summary.    Orders Placed This Encounter  Procedures   US Venous Img Lower Unilateral Right (DVT)   No orders of the defined types were placed in this encounter.  Referral Orders  No referral(s) requested today     Note is dictated utilizing voice recognition software. Although note has been proof read prior to signing, occasional typographical errors still can be missed. If any questions arise, please do not hesitate to call for verification.   electronically signed by:  Felix Pacini, DO  [facility] - OR

## 2023-07-15 ENCOUNTER — Telehealth: Payer: Self-pay

## 2023-07-15 ENCOUNTER — Ambulatory Visit (HOSPITAL_COMMUNITY)
Admission: RE | Admit: 2023-07-15 | Discharge: 2023-07-15 | Disposition: A | Discharge status: Home / Self Care | Payer: Managed Care, Other (non HMO) | Source: Ambulatory Visit | Attending: Family Medicine | Admitting: Family Medicine

## 2023-07-15 DIAGNOSIS — M25561 Pain in right knee: Secondary | ICD-10-CM | POA: Diagnosis present

## 2023-07-15 DIAGNOSIS — M7121 Synovial cyst of popliteal space [Baker], right knee: Secondary | ICD-10-CM

## 2023-07-15 NOTE — Progress Notes (Signed)
Right lower extremity venous duplex has been completed. Preliminary results can be found in CV Proc through chart review.  Results were given to [facility] at Dr. Alan Ripper office.  07/15/23 10:04 AM Olen Cordial RVT

## 2023-07-15 NOTE — Telephone Encounter (Signed)
Received call report from Vascular ultrasound. Pt is negative for a DVT but she does have a Bakers cyst in her right popliteal fossa.

## 2023-07-16 ENCOUNTER — Ambulatory Visit: Payer: Managed Care, Other (non HMO) | Admitting: Family Medicine

## 2023-07-16 DIAGNOSIS — M7121 Synovial cyst of popliteal space [Baker], right knee: Secondary | ICD-10-CM | POA: Insufficient documentation

## 2023-07-16 MED ORDER — METHYLPREDNISOLONE 4 MG PO TBPK: ORAL_TABLET | ORAL | 0 refills | Status: DC

## 2023-07-16 NOTE — Telephone Encounter (Signed)
Please call patient Her ultrasound is reassuring.  No DVT/blood clot present. The discomfort the back of her knee is from a Baker's cyst, which is what we discussed during her visit as a possibility.  Wearing a knee compression sleeve, which can be purchased over-the-counter will help resolve issue over time.  Can take 4 to 6 weeks to resolve sometimes even longer. I called in a steroid taper pack.  It is likely the Baker's cyst is being caused by an arthritis flare of her knee.   If symptoms persist after 4 weeks, neck step would be to see an orthopedic, if desiring further intervention.  She can discontinue the baby aspirin.

## 2023-07-16 NOTE — Addendum Note (Signed)
Addended by: Felix Pacini A on: 07/16/2023 07:11 AM   Modules accepted: Orders

## 2023-07-19 ENCOUNTER — Telehealth: Payer: Managed Care, Other (non HMO) | Admitting: Physician Assistant

## 2023-07-19 DIAGNOSIS — R6889 Other general symptoms and signs: Secondary | ICD-10-CM | POA: Diagnosis not present

## 2023-07-19 MED ORDER — IBUPROFEN 600 MG PO TABS
600.0000 mg | ORAL_TABLET | Freq: Three times a day (TID) | ORAL | 0 refills | Status: DC | PRN
Start: 2023-07-19 — End: 2024-03-10

## 2023-07-19 MED ORDER — FLUTICASONE PROPIONATE 50 MCG/ACT NA SUSP
2.0000 | Freq: Every day | NASAL | 0 refills | Status: DC
Start: 2023-07-19 — End: 2023-09-15

## 2023-07-19 MED ORDER — BENZONATATE 100 MG PO CAPS: 100.0000 mg | ORAL_CAPSULE | Freq: Three times a day (TID) | ORAL | 0 refills | Status: DC | PRN

## 2023-07-19 NOTE — Progress Notes (Signed)
E-Visit for Upper Respiratory Infection   We are sorry you are not feeling well.  Here is how we plan to help!  Based on what you have shared with me, it looks like you may have a viral upper respiratory infection.  Upper respiratory infections are caused by a large number of viruses; however, rhinovirus is the most common cause.   Symptoms vary from person to person, with common symptoms including sore throat, cough, fatigue or lack of energy and feeling of general discomfort.  A low-grade fever of up to 100.4 may present, but is often uncommon.  Symptoms vary however, and are closely related to a person's age or underlying illnesses.  The most common symptoms associated with an upper respiratory infection are nasal discharge or congestion, cough, sneezing, headache and pressure in the ears and face.  These symptoms usually persist for about 3 to 10 days, but can last up to 2 weeks.  It is important to know that upper respiratory infections do not cause serious illness or complications in most cases.    Upper respiratory infections can be transmitted from person to person, with the most common method of transmission being a person's hands.  The virus is able to live on the skin and can infect other persons for up to 2 hours after direct contact.  Also, these can be transmitted when someone coughs or sneezes; thus, it is important to cover the mouth to reduce this risk.  To keep the spread of the illness at bay, good hand hygiene is very important.  This is an infection that is most likely caused by a virus. There are no specific treatments other than to help you with the symptoms until the infection runs its course.  We are sorry you are not feeling well.  Here is how we plan to help!   For nasal congestion, you may use an oral decongestants such as Mucinex D or if you have glaucoma or high blood pressure use plain Mucinex.  Saline nasal spray or nasal drops can help and can safely be used as often as  needed for congestion.  For your congestion, I have prescribed Fluticasone nasal spray one spray in each nostril twice a day  If you do not have a history of heart disease, hypertension, diabetes or thyroid disease, prostate/bladder issues or glaucoma, you may also use Sudafed to treat nasal congestion.  It is highly recommended that you consult with a pharmacist or your primary care physician to ensure this medication is safe for you to take.     If you have a cough, you may use cough suppressants such as Delsym and Robitussin.  If you have glaucoma or high blood pressure, you can also use Coricidin HBP.   For cough I have prescribed for you A prescription cough medication called Tessalon Perles 100 mg. You may take 1-2 capsules every 8 hours as needed for cough  If you have a sore or scratchy throat, use a saltwater gargle-  to  teaspoon of salt dissolved in a 4-ounce to 8-ounce glass of warm water.  Gargle the solution for approximately 15-30 seconds and then spit.  It is important not to swallow the solution.  You can also use throat lozenges/cough drops and Chloraseptic spray to help with throat pain or discomfort.  Warm or cold liquids can also be helpful in relieving throat pain.  For headache, pain or general discomfort, you can use Tylenol as directed. I will prescribe Ibuprofen 600mg  Take 1  every 8 hours as needed for pain. Alternate with Tylenol every 4 hours if needed.    Some authorities believe that zinc sprays or the use of Echinacea may shorten the course of your symptoms.   HOME CARE Only take medications as instructed by your medical team. Be sure to drink plenty of fluids. Water is fine as well as fruit juices, sodas and electrolyte beverages. You may want to stay away from caffeine or alcohol. If you are nauseated, try taking small sips of liquids. How do you know if you are getting enough fluid? Your urine should be a pale yellow or almost colorless. Get rest. Taking a steamy  shower or using a humidifier may help nasal congestion and ease sore throat pain. You can place a towel over your head and breathe in the steam from hot water coming from a faucet. Using a saline nasal spray works much the same way. Cough drops, hard candies and sore throat lozenges may ease your cough. Avoid close contacts especially the very young and the elderly Cover your mouth if you cough or sneeze Always remember to wash your hands.   GET HELP RIGHT AWAY IF: You develop worsening fever. If your symptoms do not improve within 10 days You develop yellow or green discharge from your nose over 3 days. You have coughing fits You develop a severe head ache or visual changes. You develop shortness of breath, difficulty breathing or start having chest pain Your symptoms persist after you have completed your treatment plan  MAKE SURE YOU  Understand these instructions. Will watch your condition. Will get help right away if you are not doing well or get worse.  Thank you for choosing an e-visit.  Your e-visit answers were reviewed by a board certified advanced clinical practitioner to complete your personal care plan. Depending upon the condition, your plan could have included both over the counter or prescription medications.  Please review your pharmacy choice. Make sure the pharmacy is open so you can pick up prescription now. If there is a problem, you may contact your provider through [employer] and have the prescription routed to another pharmacy.  Your safety is important to [place]. If you have drug allergies check your prescription carefully.   For the next 24 hours you can use MyChart to ask questions about today's visit, request a non-urgent call back, or ask for a work or school excuse. You will get an email in the next two days asking about your experience. I hope that your e-visit has been valuable and will speed your recovery.    I have spent 5 minutes in review of  e-visit questionnaire, review and updating patient chart, medical decision making and response to patient.   Margaretann Loveless, PA-C

## 2023-09-09 ENCOUNTER — Encounter: Payer: Self-pay | Admitting: Family Medicine

## 2023-09-09 DIAGNOSIS — M1711 Unilateral primary osteoarthritis, right knee: Secondary | ICD-10-CM

## 2023-09-09 DIAGNOSIS — M7121 Synovial cyst of popliteal space [Baker], right knee: Secondary | ICD-10-CM

## 2023-09-09 NOTE — Telephone Encounter (Signed)
 Please inform patient I have placed a referral to orthopedics-Dr. Lucienne Ryder has requested for her knee.

## 2023-09-15 ENCOUNTER — Telehealth: Payer: Managed Care, Other (non HMO) | Admitting: Family

## 2023-09-15 DIAGNOSIS — J209 Acute bronchitis, unspecified: Secondary | ICD-10-CM | POA: Diagnosis not present

## 2023-09-15 MED ORDER — BENZONATATE 100 MG PO CAPS: 100.0000 mg | ORAL_CAPSULE | Freq: Three times a day (TID) | ORAL | 0 refills | Status: DC | PRN

## 2023-09-15 MED ORDER — FLUTICASONE PROPIONATE 50 MCG/ACT NA SUSP
2.0000 | Freq: Every day | NASAL | 6 refills | Status: DC
Start: 2023-09-15 — End: 2024-03-10

## 2023-09-15 NOTE — Progress Notes (Signed)
E-Visit for Cough   We are sorry that you are not feeling well.  Here is how we plan to help!  Based on your presentation I believe you most likely have A cough due to a virus.  This is called viral bronchitis and is best treated by rest, plenty of fluids and control of the cough.  You may use Ibuprofen or Tylenol as directed to help your symptoms.     In addition you may use A non-prescription cough medication called Robitussin DAC. Take 2 teaspoons every 8 hours or Delsym: take 2 teaspoons every 12 hours., A non-prescription cough medication called Mucinex DM: take 2 tablets every 12 hours., and A prescription cough medication called Tessalon Perles 100mg . You may take 1-2 capsules every 8 hours as needed for your cough.  I have also sent in flonase nasal spray.   From your responses in the eVisit questionnaire you describe inflammation in the upper respiratory tract which is causing a significant cough.  This is commonly called Bronchitis and has four common causes:   Allergies Viral Infections Acid Reflux Bacterial Infection Allergies, viruses and acid reflux are treated by controlling symptoms or eliminating the cause. An example might be a cough caused by taking certain blood pressure medications. You stop the cough by changing the medication. Another example might be a cough caused by acid reflux. Controlling the reflux helps control the cough.  USE OF BRONCHODILATOR ("RESCUE") INHALERS: There is a risk from using your bronchodilator too frequently.  The risk is that over-reliance on a medication which only relaxes the muscles surrounding the breathing tubes can reduce the effectiveness of medications prescribed to reduce swelling and congestion of the tubes themselves.  Although you feel brief relief from the bronchodilator inhaler, your asthma may actually be worsening with the tubes becoming more swollen and filled with mucus.  This can delay other crucial treatments, such as oral  steroid medications. If you need to use a bronchodilator inhaler daily, several times per day, you should discuss this with your provider.  There are probably better treatments that could be used to keep your asthma under control.     HOME CARE Only take medications as instructed by your medical team. Complete the entire course of an antibiotic. Drink plenty of fluids and get plenty of rest. Avoid close contacts especially the very young and the elderly Cover your mouth if you cough or cough into your sleeve. Always remember to wash your hands A steam or ultrasonic humidifier can help congestion.   GET HELP RIGHT AWAY IF: You develop worsening fever. You become short of breath You cough up blood. Your symptoms persist after you have completed your treatment plan MAKE SURE YOU  Understand these instructions. Will watch your condition. Will get help right away if you are not doing well or get worse.    Thank you for choosing an e-visit.  Your e-visit answers were reviewed by a board certified advanced clinical practitioner to complete your personal care plan. Depending upon the condition, your plan could have included both over the counter or prescription medications.  Please review your pharmacy choice. Make sure the pharmacy is open so you can pick up prescription now. If there is a problem, you may contact your provider through [employer] and have the prescription routed to another pharmacy.  Your safety is important to [place]. If you have drug allergies check your prescription carefully.   For the next 24 hours you can use MyChart to ask  questions about today's visit, request a non-urgent call back, or ask for a work or school excuse. You will get an email in the next two days asking about your experience. I hope that your e-visit has been valuable and will speed your recovery.  Approximately 5 minutes was spent documenting and reviewing patient's chart.

## 2023-09-30 ENCOUNTER — Other Ambulatory Visit (INDEPENDENT_AMBULATORY_CARE_PROVIDER_SITE_OTHER): Payer: Self-pay

## 2023-09-30 ENCOUNTER — Ambulatory Visit (INDEPENDENT_AMBULATORY_CARE_PROVIDER_SITE_OTHER): Payer: Managed Care, Other (non HMO) | Admitting: Orthopaedic Surgery

## 2023-09-30 ENCOUNTER — Encounter: Payer: Self-pay | Admitting: Orthopaedic Surgery

## 2023-09-30 VITALS — Ht 63.5 in | Wt 184.0 lb

## 2023-09-30 DIAGNOSIS — M25561 Pain in right knee: Secondary | ICD-10-CM | POA: Diagnosis not present

## 2023-09-30 DIAGNOSIS — G8929 Other chronic pain: Secondary | ICD-10-CM

## 2023-09-30 DIAGNOSIS — M25551 Pain in right hip: Secondary | ICD-10-CM | POA: Diagnosis not present

## 2023-09-30 MED ORDER — METHYLPREDNISOLONE ACETATE 40 MG/ML IJ SUSP
40.0000 mg | INTRAMUSCULAR | Status: AC | PRN
  Administered 2023-09-30: 40 mg via INTRA_ARTICULAR

## 2023-09-30 MED ORDER — LIDOCAINE HCL 1 % IJ SOLN
3.0000 mL | INTRAMUSCULAR | Status: AC | PRN
  Administered 2023-09-30: 3 mL

## 2023-09-30 NOTE — Progress Notes (Signed)
 The patient is a very pleasant 58 year old female who has been dealing with some right knee pain for many years now and right hip pain.  She points to the trochanteric area of the right hip as a source of her pain and some globally around the right knee with no known injury of either area.  She says she does have a trip this summer going to [place] and [place] wants to be able to function well for sitting for long period time and a lot of walking.  She has never had injections.  She is not significantly obese at all.  She is not a diabetic.  I was able to review past medical history and medications within epic.  On exam her right knee shows no effusion.  There is some swelling of the pes bursa area and some lateral pain at the IT band.  The knee moves well and is ligamentously stable.  The right hip moves smoothly and fluidly with pain that is significant over the trochanteric of the right hip.  X-rays of both the right knee and the right hip show no significant arthritic changes at all.  It is a little bit on the right knee in terms of slight narrowing at the patellofemoral joint and medial narrowing but this is minimal.  I did place a steroid injection in her right knee today and her right hip trochanteric area which she tolerated well.  We can always do this before her trip in June.  From a radiographic standpoint, I would obtain an MRI of her right knee if it keeps bothering her.  All question concerns were answered and addressed.    Procedure Note  Patient: Sarah Krause             Date of Birth: 1966-02-09           MRN: [id number]             Visit Date: 09/30/2023  Procedures: Visit Diagnoses:  1. Chronic pain of right knee   2. Pain of right hip     Large Joint Inj: R knee on 09/30/2023 2:47 PM Indications: diagnostic evaluation and pain Details: 22 G 1.5 in needle, superolateral approach  Arthrogram: No  Medications: 3 mL lidocaine 1 %; 40 mg methylPREDNISolone acetate 40  MG/ML Outcome: tolerated well, no immediate complications Procedure, treatment alternatives, risks and benefits explained, specific risks discussed. Consent was given by the patient. Immediately prior to procedure a time out was called to verify the correct patient, procedure, equipment, support staff and site/side marked as required. Patient was prepped and draped in the usual sterile fashion.    Large Joint Inj: R greater trochanter on 09/30/2023 2:47 PM Indications: pain and diagnostic evaluation Details: 22 G 1.5 in needle, lateral approach  Arthrogram: No  Medications: 3 mL lidocaine 1 %; 40 mg methylPREDNISolone acetate 40 MG/ML Outcome: tolerated well, no immediate complications Procedure, treatment alternatives, risks and benefits explained, specific risks discussed. Consent was given by the patient. Immediately prior to procedure a time out was called to verify the correct patient, procedure, equipment, support staff and site/side marked as required. Patient was prepped and draped in the usual sterile fashion.

## 2023-12-30 ENCOUNTER — Encounter: Payer: Self-pay | Admitting: Orthopaedic Surgery

## 2023-12-30 ENCOUNTER — Ambulatory Visit: Payer: Managed Care, Other (non HMO) | Admitting: Orthopaedic Surgery

## 2023-12-30 DIAGNOSIS — G8929 Other chronic pain: Secondary | ICD-10-CM

## 2023-12-30 DIAGNOSIS — M25551 Pain in right hip: Secondary | ICD-10-CM

## 2023-12-30 DIAGNOSIS — M25561 Pain in right knee: Secondary | ICD-10-CM

## 2023-12-30 MED ORDER — METHYLPREDNISOLONE ACETATE 40 MG/ML IJ SUSP
40.0000 mg | INTRAMUSCULAR | Status: AC | PRN
  Administered 2023-12-30: 40 mg via INTRA_ARTICULAR

## 2023-12-30 MED ORDER — LIDOCAINE HCL 1 % IJ SOLN
3.0000 mL | INTRAMUSCULAR | Status: AC | PRN
  Administered 2023-12-30: 3 mL

## 2023-12-30 NOTE — Progress Notes (Signed)
 The patient is a 58 year old female well-known to me.  She is leaving for a trip soon [place] and comes in today for steroid injection in her right hip trochanteric area and her right knee.  We have done these in the past and that has helped her quite a bit.  She said the injections worked great last time until about 2 weeks ago.  On exam her right knee has just a little bit of fullness to the knee but also present I could not aspirate a lot of fluid from the knee.  I did place a steroid injection her right knee without difficulty and then in her right hip trochanteric area where she has some tenderness and pain.  She tolerated these well.  We will see her back as needed but at some point we may end up MRI in her right knee if she continues to have issues.  She will let us  know.    Procedure Note  Patient: Sarah Krause             Date of Birth: [birth year]           MRN: [id number]             Visit Date: 12/30/2023  Procedures: Visit Diagnoses:  1. Chronic pain of right knee   2. Pain of right hip     Large Joint Inj: R knee on 12/30/2023 3:06 PM Indications: diagnostic evaluation and pain Details: 22 G 1.5 in needle, superolateral approach  Arthrogram: No  Medications: 3 mL lidocaine  1 %; 40 mg methylPREDNISolone  acetate 40 MG/ML Outcome: tolerated well, no immediate complications Procedure, treatment alternatives, risks and benefits explained, specific risks discussed. Consent was given by the patient. Immediately prior to procedure a time out was called to verify the correct patient, procedure, equipment, support staff and site/side marked as required. Patient was prepped and draped in the usual sterile fashion.    Large Joint Inj: R greater trochanter on 12/30/2023 3:06 PM Indications: pain and diagnostic evaluation Details: 22 G 1.5 in needle, lateral approach  Arthrogram: No  Medications: 3 mL lidocaine  1 %; 40 mg methylPREDNISolone  acetate 40 MG/ML Outcome: tolerated  well, no immediate complications Procedure, treatment alternatives, risks and benefits explained, specific risks discussed. Consent was given by the patient. Immediately prior to procedure a time out was called to verify the correct patient, procedure, equipment, support staff and site/side marked as required. Patient was prepped and draped in the usual sterile fashion.

## 2024-02-11 ENCOUNTER — Encounter: Payer: Self-pay | Admitting: Family Medicine

## 2024-03-10 ENCOUNTER — Ambulatory Visit (INDEPENDENT_AMBULATORY_CARE_PROVIDER_SITE_OTHER): Admitting: Family Medicine

## 2024-03-10 ENCOUNTER — Encounter: Payer: Self-pay | Admitting: Family Medicine

## 2024-03-10 VITALS — BP 128/82 | HR 74 | Temp 98.2°F | Wt 193.0 lb

## 2024-03-10 DIAGNOSIS — Z5320 Procedure and treatment not carried out because of patient's decision for unspecified reasons: Secondary | ICD-10-CM | POA: Diagnosis not present

## 2024-03-10 DIAGNOSIS — L989 Disorder of the skin and subcutaneous tissue, unspecified: Secondary | ICD-10-CM | POA: Insufficient documentation

## 2024-03-10 NOTE — Progress Notes (Signed)
 Sarah Krause , 1966/06/26, 58 y.o., female MRN: [id number] Patient Care Team    Relationship Specialty Notifications Start End  Catherine Charlies LABOR, DO PCP - General Family Medicine  08/14/19   Teressa Cain, MD  Obstetrics and Gynecology  08/17/19   Beuford Anes, MD Consulting Physician Orthopedic Surgery  08/17/19   Gerlean Reyes Barters, MD Referring Physician Vascular Surgery  08/17/19   Gretel Ozell PARAS, DPM (Inactive) Consulting Physician Podiatry  08/17/19     Chief Complaint  Patient presents with   Skin Concern    R arm; started noticing changes in color and size. Pt denies any irritation.      Subjective: Sarah Krause is a 58 y.o. Pt presents for an OV with complaints of changing skin lesion of 8 mos duration on her upper right arm/ant shoulder region.  Associated symptoms include flakiness started a week ago. Has had precancerous lesions in the past x2 other arm.      02/10/2023    8:56 AM 05/27/2021   11:30 AM 09/17/2020    2:03 PM 08/14/2019   10:15 AM  Depression screen PHQ 2/9  Decreased Interest 0 0 0 0  Down, Depressed, Hopeless 0 0 0 0  PHQ - 2 Score 0 0 0 0    Allergies  Allergen Reactions   Almond Oil Anaphylaxis and Hives   Other Swelling     ALMONDS   Sulfa Antibiotics Hives   Social History   Social History Narrative   Marital status/children/pets: Married.  2 children.   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   History of kidney stones    passed   Migraines    Precancerous skin lesion    Abdomen and arm.   Symptomatic varicose veins, right 2016   Patient had x2 surgery on varicosities   Past Surgical History:  Procedure Laterality Date   ABDOMINAL HYSTERECTOMY  2006   Partial hysterectomy with bladder tact   CHOLECYSTECTOMY  2004   COLONOSCOPY     has had 2  as 6//5/19   FOOT SURGERY Right 01/2021   image CT abd/pelvis  09/07/2014   Rectosigmoid colonic wall thickening with adjacent fat stranding compatible with diverticulitis.  Mild common duct dilatation in the setting of prior cholecystectomy, felt to be incidental and likely at the upper end of normal range considering no right upper quadrant pain at the time.   Image MRI:  12/02/2017   L 2-3 mild facet joint arthritis.  L3-4 mild facet joint arthritis.  L4-5 mild facet joint arthritis.  L5-S1 large disc extrusion on the right with compression of the right side of the thecal sac and the right S1 nerve.  Image: ABI BLE  10/14/2020   Normal studies bilateral lower extremity; no signs of arterial disease   image: DG right knee  04/19/2011   Mild patellofemoral and medial compartment osteoarthritis. Tiny cortical buckle in the posterior lateral tibial plateau-normal variant.   image: RLE Doppler  09/2020   Negative for DVT right lower extremity   image:DG right hip xray  11/09/2017   No acute fracture or malalignment.  Mild bilateral hip degenerative changes.  Mild degenerative disc and facet disease of the lower lumbar spine.   INCONTINENCE SURGERY  2016   Bladder mesh   LUMBAR LAMINECTOMY/DECOMPRESSION MICRODISCECTOMY Right 01/05/2018   Procedure: RIGHT SIDED LUMBAR FIVE - SACRUM ONE MICRODISECTOMY;  Surgeon: Beuford Anes, MD;  Location: [facility] OR;  Service: Orthopedics;  Laterality: Right;   TONSILLECTOMY  1971   Varicose veins Right 2016    March & June 2016.   Family History  Problem Relation Age of Onset   Arthritis Mother    Colitis Mother    Macular degeneration Mother    Heart disease Father    Heart attack Father        died 70   Diabetes Brother    Arthritis Brother    Hyperlipidemia Brother    Macular degeneration Brother    Ulcerative colitis Sister    Parkinson's disease Maternal Grandfather    Parkinson's disease Brother    Allergies as of 03/10/2024       Reactions   Almond Oil Anaphylaxis, Hives   Other Swelling   ALMONDS   Sulfa Antibiotics Hives        Medication List        Accurate as of March 10, 2024 10:42 AM. If you have any questions, ask your nurse or doctor.          STOP taking these medications    benzonatate  100 MG capsule Commonly known as: Tessalon  Perles Stopped by: Charlies Bellini   fluticasone  50 MCG/ACT nasal spray Commonly known as: FLONASE  Stopped by: Charlies Bellini   ibuprofen  600 MG tablet Commonly known as: ADVIL  Stopped by: Charlies Bellini   methylPREDNISolone  4 MG Tbpk tablet Commonly known as: MEDROL  DOSEPAK Stopped by: Charlies Bellini       TAKE these medications    CALCIUM-MAGNESIUM-ZINC-D3 PO Take by mouth.   HAIR SKIN NAILS GUMMIES PO Take by mouth.   multivitamin tablet Take 1 tablet by mouth daily.   OSTEO BI-FLEX REGULAR STRENGTH PO Take by mouth.        All past medical history, surgical history, allergies, family history, immunizations andmedications were updated in the EMR today and reviewed under the history and medication portions of their EMR.     ROS Negative, with the exception of above mentioned in HPI   Objective:  BP 128/82   Pulse 74   Temp 98.2 F (36.8 C)   Wt 193 lb (87.5 kg)   SpO2 98%   BMI 33.65 kg/m  Body mass index is 33.65 kg/m. Physical Exam Vitals and nursing note reviewed.  Constitutional:      General: She is not in acute distress.    Appearance: Normal appearance. She is normal weight. She is  not ill-appearing or toxic-appearing.  HENT:     Head: Normocephalic and atraumatic.  Eyes:     General: No scleral icterus.       Right eye: No discharge.        Left eye: No discharge.     Extraocular Movements: Extraocular movements  intact.     Conjunctiva/sclera: Conjunctivae normal.     Pupils: Pupils are equal, round, and reactive to light.  Skin:    Findings: Lesion present. No rash.     Comments: 6 x 8 mm flat lesion, hyperpigmented with some areas of erythema.  Dry scaliness present.  Neurological:     Mental Status: She is alert and oriented to person, place, and time. Mental status is at baseline.     Motor: No weakness.     Coordination: Coordination normal.     Gait: Gait normal.  Psychiatric:        Mood and Affect: Mood normal.        Behavior: Behavior normal.        Thought Content: Thought content normal.        Judgment: Judgment normal.      No results found. No results found. No results found for this or any previous visit (from the past 24 hours).  Assessment/Plan: Sarah Krause is a 58 y.o. female present for OV for   Mammogram declined  Skin lesion/skin lesion of right arm (Primary) Skin lesion appears to be consistent with possible basal cell carcinoma, cannot rule out other potential causes for cancers.  She is agreeable to dermatology referral today. She does have a history of precancerous lesion removal on her left arm in the past. - Ambulatory referral to Dermatology  Reviewed expectations re: course of current medical issues. Discussed self-management of symptoms. Outlined signs and symptoms indicating need for more acute intervention. Patient verbalized understanding and all questions were answered. Patient received an After-Visit Summary.    Orders Placed This Encounter  Procedures   Ambulatory referral to Dermatology   No orders of the defined types were placed in this encounter.  Referral Orders         Ambulatory referral  to Dermatology       Note is dictated utilizing voice recognition software. Although note has been proof read prior to signing, occasional typographical errors still can be missed. If any questions arise, please do not hesitate to call for verification.   electronically signed by:  Charlies Bellini, DO  [facility] - OR

## 2024-03-10 NOTE — Patient Instructions (Addendum)

## 2024-03-24 ENCOUNTER — Encounter: Payer: Self-pay | Admitting: Family Medicine

## 2024-04-10 ENCOUNTER — Ambulatory Visit (INDEPENDENT_AMBULATORY_CARE_PROVIDER_SITE_OTHER): Payer: Self-pay | Admitting: Orthopaedic Surgery

## 2024-04-10 VITALS — Wt 197.0 lb

## 2024-04-10 DIAGNOSIS — G8929 Other chronic pain: Secondary | ICD-10-CM

## 2024-04-10 DIAGNOSIS — M25561 Pain in right knee: Secondary | ICD-10-CM

## 2024-04-10 DIAGNOSIS — M25551 Pain in right hip: Secondary | ICD-10-CM | POA: Diagnosis not present

## 2024-04-10 MED ORDER — LIDOCAINE HCL 1 % IJ SOLN
3.0000 mL | INTRAMUSCULAR | Status: AC | PRN
  Administered 2024-04-10: 3 mL

## 2024-04-10 MED ORDER — METHYLPREDNISOLONE ACETATE 40 MG/ML IJ SUSP
40.0000 mg | INTRAMUSCULAR | Status: AC | PRN
  Administered 2024-04-10: 40 mg via INTRA_ARTICULAR

## 2024-04-10 MED ORDER — LIDOCAINE HCL 1 % IJ SOLN
3.0000 mL | INTRAMUSCULAR | Status: AC | PRN
Start: 2024-04-10 — End: 2024-04-10
  Administered 2024-04-10: 3 mL

## 2024-04-10 NOTE — Progress Notes (Signed)
 The patient is a 58 year old well-known to me.  We have injected her right hip and her right knee several times in this last year.  The right hip has been trochanteric bursitis and the right knee has been a lot of pain on the medial aspect of her knee and I am concerned about a meniscal tear given the continued pain she is having with locking and catching.  Today she is appropriately requesting injections around her right hip and her right knee.  She has had an accumulation of fluid off and on with the right knee.  A lot of her pain is medial and posterior medial of the right knee.  This will be the third time we have injected her this year and it has always been just over 3 months.  Examination of right hip shows it moves smoothly and fluidly with a lot of pain at the trochanteric area of her hip and the IT band.  Her right knee has pain over the pes bursa with her swelling but also positive McMurray's exam to the medial compartment of the knee and pain throughout the arc of motion of the right knee.  Previous x-rays of the right hip are normal and the right knee she has medial joint space narrowing but not bone-on-bone.  Per request I did place a steroid injection around her right hip trochanteric area and a steroid injection again her right knee.  At this point a MRI of her right knee is medically warranted given the failure of conservative treatment over 6 months now of the right knee including multiple steroid injections.  We need to rule out a meniscal tear and to assess the cartilage in her knee.  I think that is driving a lot of her hip pain as well on how she is walking favoring her knee.  We will see her back in follow-up once we have the MRI of her right knee.  She agrees with this treatment plan.    Procedure Note  Patient: Sarah Krause             Date of Birth: 08/21/1965           MRN: [id number]             Visit Date: 04/10/2024  Procedures: Visit Diagnoses:  1. Chronic pain  of right knee   2. Pain of right hip     Large Joint Inj: R knee on 04/10/2024 9:10 AM Indications: diagnostic evaluation and pain Details: 22 G 1.5 in needle, superolateral approach  Arthrogram: No  Medications: 3 mL lidocaine  1 %; 40 mg methylPREDNISolone  acetate 40 MG/ML Outcome: tolerated well, no immediate complications Procedure, treatment alternatives, risks and benefits explained, specific risks discussed. Consent was given by the patient. Immediately prior to procedure a time out was called to verify the correct patient, procedure, equipment, support staff and site/side marked as required. Patient was prepped and draped in the usual sterile fashion.    Large Joint Inj: R greater trochanter on 04/10/2024 9:11 AM Indications: pain and diagnostic evaluation Details: 22 G 1.5 in needle, lateral approach  Arthrogram: No  Medications: 3 mL lidocaine  1 %; 40 mg methylPREDNISolone  acetate 40 MG/ML Outcome: tolerated well, no immediate complications Procedure, treatment alternatives, risks and benefits explained, specific risks discussed. Consent was given by the patient. Immediately prior to procedure a time out was called to verify the correct patient, procedure, equipment, support staff and site/side marked as required. Patient was prepped and draped in  the usual sterile fashion.

## 2024-04-11 ENCOUNTER — Other Ambulatory Visit: Payer: Self-pay | Admitting: Radiology

## 2024-04-11 DIAGNOSIS — G8929 Other chronic pain: Secondary | ICD-10-CM

## 2024-04-15 ENCOUNTER — Inpatient Hospital Stay
Admission: RE | Admit: 2024-04-15 | Discharge: 2024-04-15 | Discharge status: Home / Self Care | Source: Ambulatory Visit | Attending: Orthopaedic Surgery | Admitting: Orthopaedic Surgery

## 2024-04-15 DIAGNOSIS — G8929 Other chronic pain: Secondary | ICD-10-CM

## 2024-05-08 ENCOUNTER — Encounter: Payer: Self-pay | Admitting: Orthopaedic Surgery

## 2024-05-08 ENCOUNTER — Other Ambulatory Visit: Payer: Self-pay

## 2024-05-08 ENCOUNTER — Ambulatory Visit: Admitting: Orthopaedic Surgery

## 2024-05-08 DIAGNOSIS — G8929 Other chronic pain: Secondary | ICD-10-CM | POA: Diagnosis not present

## 2024-05-08 DIAGNOSIS — M25561 Pain in right knee: Secondary | ICD-10-CM

## 2024-05-08 DIAGNOSIS — M1711 Unilateral primary osteoarthritis, right knee: Secondary | ICD-10-CM

## 2024-05-08 NOTE — Progress Notes (Signed)
 The patient is a very active 58 year old female who comes in today to go over MRI of her right knee due to chronic pain she has had in that knee.  She still gets clicking in the knee but she does have pain with rest and pain that wakes her up at night with no locking catching or mechanical symptoms.  She has tried and failed all forms of conservative treatment including steroid injections.  The MRI of the patient's right knee does show a chronic and complex degenerative meniscal tear from the posterior horn to the mid body.  There are areas of near full-thickness cartilage loss of the weightbearing surface of the medial compartment of her knee with thinning of the lateral compartment and patellofemoral joint.  There is a mild effusion as well as a small Baker's cyst.  There is no subchondral edema.  We had a long and thorough discussion about her knee.  I do not feel that an arthroscopic intervention will help with the arthritic pain that she is experiencing.  She does have meniscal tear and I do feel that this is contributing to some of her pain but not as much as what the arthritic changes in her knee at the weightbearing surface are causing.  I do feel it is worth trying a round of hyaluronic acid for her right knee given the failure of other conservative treat measures to treat her arthritis.  We talked about the possibility of knee replacement surgery versus arthroscopic surgery but I think knee replacement is what would be more warranted based on the MRI findings of thinning cartilage.  She agrees with trying the conservative approach in terms of at least a one-time hyaluronic acid injection in the right knee.  Will work on getting this approved and ordered.  This patient is diagnosed with osteoarthritis of the knee(s).    Radiographs show evidence of joint space narrowing, osteophytes, subchondral sclerosis and/or subchondral cysts.  This patient has knee pain which interferes with functional and  activities of daily living.    This patient has experienced inadequate response, adverse effects and/or intolerance with conservative treatments such as acetaminophen , NSAIDS, topical creams, physical therapy or regular exercise, knee bracing and/or weight loss.   This patient has experienced inadequate response or has a contraindication to intra articular steroid injections for at least 3 months.   This patient is not scheduled to have a total knee replacement within 6 months of starting treatment with viscosupplementation.

## 2024-06-05 ENCOUNTER — Encounter: Payer: Self-pay | Admitting: Radiology
